# Patient Record
Sex: Female | Born: 1955 | Hispanic: No | Marital: Single | Smoking: Never smoker
Health system: Southern US, Community
[De-identification: ages and names within clinical notes are randomized; demographics above are authoritative.]

## PROBLEM LIST (undated history)

## (undated) DIAGNOSIS — E7800 Pure hypercholesterolemia, unspecified: Secondary | ICD-10-CM

## (undated) DIAGNOSIS — E119 Type 2 diabetes mellitus without complications: Secondary | ICD-10-CM

## (undated) DIAGNOSIS — I10 Essential (primary) hypertension: Secondary | ICD-10-CM

## (undated) DIAGNOSIS — F329 Major depressive disorder, single episode, unspecified: Secondary | ICD-10-CM

## (undated) DIAGNOSIS — G43909 Migraine, unspecified, not intractable, without status migrainosus: Secondary | ICD-10-CM

## (undated) DIAGNOSIS — J45909 Unspecified asthma, uncomplicated: Secondary | ICD-10-CM

## (undated) DIAGNOSIS — F32A Depression, unspecified: Secondary | ICD-10-CM

## (undated) HISTORY — DX: Depression, unspecified: F32.A

## (undated) HISTORY — DX: Pure hypercholesterolemia, unspecified: E78.00

## (undated) HISTORY — DX: Major depressive disorder, single episode, unspecified: F32.9

## (undated) HISTORY — DX: Migraine, unspecified, not intractable, without status migrainosus: G43.909

## (undated) HISTORY — PX: NO PAST SURGERIES: SHX2092

---

## 2000-10-04 ENCOUNTER — Emergency Department (HOSPITAL_COMMUNITY): Admission: EM | Admit: 2000-10-04 | Discharge: 2000-10-05 | Payer: Self-pay | Admitting: *Deleted

## 2001-08-31 ENCOUNTER — Inpatient Hospital Stay (HOSPITAL_COMMUNITY): Admission: EM | Admit: 2001-08-31 | Discharge: 2001-09-04 | Payer: Self-pay | Admitting: *Deleted

## 2001-09-03 ENCOUNTER — Encounter: Payer: Self-pay | Admitting: Internal Medicine

## 2001-10-21 ENCOUNTER — Emergency Department (HOSPITAL_COMMUNITY): Admission: EM | Admit: 2001-10-21 | Discharge: 2001-10-21 | Payer: Self-pay | Admitting: *Deleted

## 2001-10-21 ENCOUNTER — Encounter: Payer: Self-pay | Admitting: *Deleted

## 2002-09-03 ENCOUNTER — Ambulatory Visit (HOSPITAL_COMMUNITY): Admission: RE | Admit: 2002-09-03 | Discharge: 2002-09-03 | Payer: Self-pay | Admitting: Family Medicine

## 2004-01-21 ENCOUNTER — Ambulatory Visit (HOSPITAL_COMMUNITY): Admission: RE | Admit: 2004-01-21 | Discharge: 2004-01-21 | Payer: Self-pay | Admitting: Urology

## 2005-07-10 ENCOUNTER — Emergency Department (HOSPITAL_COMMUNITY): Admission: EM | Admit: 2005-07-10 | Discharge: 2005-07-10 | Payer: Self-pay | Admitting: Emergency Medicine

## 2007-03-01 ENCOUNTER — Ambulatory Visit (HOSPITAL_COMMUNITY): Admission: RE | Admit: 2007-03-01 | Discharge: 2007-03-01 | Payer: Self-pay | Admitting: Family Medicine

## 2007-07-28 ENCOUNTER — Emergency Department (HOSPITAL_COMMUNITY): Admission: EM | Admit: 2007-07-28 | Discharge: 2007-07-28 | Payer: Self-pay | Admitting: Emergency Medicine

## 2008-02-23 ENCOUNTER — Emergency Department (HOSPITAL_COMMUNITY): Admission: EM | Admit: 2008-02-23 | Discharge: 2008-02-23 | Payer: Self-pay | Admitting: Emergency Medicine

## 2008-12-03 ENCOUNTER — Ambulatory Visit (HOSPITAL_COMMUNITY): Admission: RE | Admit: 2008-12-03 | Discharge: 2008-12-03 | Payer: Self-pay | Admitting: Family Medicine

## 2009-02-11 ENCOUNTER — Encounter (INDEPENDENT_AMBULATORY_CARE_PROVIDER_SITE_OTHER): Payer: Self-pay | Admitting: *Deleted

## 2009-03-05 DIAGNOSIS — K644 Residual hemorrhoidal skin tags: Secondary | ICD-10-CM | POA: Insufficient documentation

## 2009-03-05 DIAGNOSIS — R51 Headache: Secondary | ICD-10-CM

## 2009-03-06 ENCOUNTER — Ambulatory Visit: Payer: Self-pay | Admitting: Urgent Care

## 2009-03-06 ENCOUNTER — Encounter (INDEPENDENT_AMBULATORY_CARE_PROVIDER_SITE_OTHER): Payer: Self-pay | Admitting: *Deleted

## 2009-03-06 DIAGNOSIS — IMO0002 Reserved for concepts with insufficient information to code with codable children: Secondary | ICD-10-CM | POA: Insufficient documentation

## 2009-03-06 DIAGNOSIS — J45909 Unspecified asthma, uncomplicated: Secondary | ICD-10-CM | POA: Insufficient documentation

## 2009-03-06 DIAGNOSIS — K219 Gastro-esophageal reflux disease without esophagitis: Secondary | ICD-10-CM | POA: Insufficient documentation

## 2009-03-06 DIAGNOSIS — E785 Hyperlipidemia, unspecified: Secondary | ICD-10-CM

## 2009-03-06 DIAGNOSIS — F1090 Alcohol use, unspecified, uncomplicated: Secondary | ICD-10-CM | POA: Insufficient documentation

## 2009-03-06 DIAGNOSIS — K921 Melena: Secondary | ICD-10-CM

## 2009-03-06 DIAGNOSIS — K5900 Constipation, unspecified: Secondary | ICD-10-CM | POA: Insufficient documentation

## 2009-03-09 ENCOUNTER — Encounter: Payer: Self-pay | Admitting: Urgent Care

## 2009-03-12 ENCOUNTER — Encounter: Payer: Self-pay | Admitting: Internal Medicine

## 2009-03-13 ENCOUNTER — Encounter: Payer: Self-pay | Admitting: Internal Medicine

## 2009-03-17 ENCOUNTER — Encounter: Payer: Self-pay | Admitting: Internal Medicine

## 2009-03-17 ENCOUNTER — Ambulatory Visit: Payer: Self-pay | Admitting: Internal Medicine

## 2009-03-17 ENCOUNTER — Ambulatory Visit (HOSPITAL_COMMUNITY): Admission: RE | Admit: 2009-03-17 | Discharge: 2009-03-17 | Payer: Self-pay | Admitting: Internal Medicine

## 2009-03-19 ENCOUNTER — Telehealth (INDEPENDENT_AMBULATORY_CARE_PROVIDER_SITE_OTHER): Payer: Self-pay

## 2009-03-20 ENCOUNTER — Telehealth (INDEPENDENT_AMBULATORY_CARE_PROVIDER_SITE_OTHER): Payer: Self-pay

## 2010-03-20 ENCOUNTER — Emergency Department (HOSPITAL_COMMUNITY): Admission: EM | Admit: 2010-03-20 | Discharge: 2010-03-20 | Payer: Self-pay | Admitting: Emergency Medicine

## 2010-06-02 ENCOUNTER — Other Ambulatory Visit: Payer: Self-pay

## 2010-06-02 DIAGNOSIS — Z139 Encounter for screening, unspecified: Secondary | ICD-10-CM

## 2010-06-04 ENCOUNTER — Ambulatory Visit (HOSPITAL_COMMUNITY): Payer: Self-pay

## 2010-06-14 ENCOUNTER — Ambulatory Visit (HOSPITAL_COMMUNITY)
Admission: RE | Admit: 2010-06-14 | Discharge: 2010-06-14 | Disposition: A | Discharge status: Home / Self Care | Payer: Self-pay | Source: Ambulatory Visit

## 2010-06-14 DIAGNOSIS — Z139 Encounter for screening, unspecified: Secondary | ICD-10-CM

## 2010-06-18 ENCOUNTER — Other Ambulatory Visit: Payer: Self-pay

## 2010-06-18 DIAGNOSIS — R928 Other abnormal and inconclusive findings on diagnostic imaging of breast: Secondary | ICD-10-CM

## 2010-06-23 ENCOUNTER — Other Ambulatory Visit: Payer: Self-pay | Admitting: Family Medicine

## 2010-06-23 ENCOUNTER — Encounter (HOSPITAL_COMMUNITY): Payer: Self-pay

## 2010-06-23 ENCOUNTER — Other Ambulatory Visit (HOSPITAL_COMMUNITY): Payer: Self-pay | Admitting: Family Medicine

## 2010-06-23 ENCOUNTER — Other Ambulatory Visit (HOSPITAL_COMMUNITY): Payer: Self-pay

## 2010-06-23 DIAGNOSIS — R928 Other abnormal and inconclusive findings on diagnostic imaging of breast: Secondary | ICD-10-CM

## 2010-06-30 ENCOUNTER — Ambulatory Visit (HOSPITAL_COMMUNITY)
Admission: RE | Admit: 2010-06-30 | Discharge: 2010-06-30 | Disposition: A | Discharge status: Home / Self Care | Payer: PRIVATE HEALTH INSURANCE | Source: Ambulatory Visit

## 2010-06-30 ENCOUNTER — Other Ambulatory Visit (HOSPITAL_COMMUNITY): Payer: PRIVATE HEALTH INSURANCE

## 2010-06-30 ENCOUNTER — Ambulatory Visit (HOSPITAL_COMMUNITY)
Admission: RE | Admit: 2010-06-30 | Discharge: 2010-06-30 | Disposition: A | Discharge status: Home / Self Care | Payer: PRIVATE HEALTH INSURANCE | Source: Ambulatory Visit | Attending: Family Medicine | Admitting: Family Medicine

## 2010-06-30 DIAGNOSIS — R928 Other abnormal and inconclusive findings on diagnostic imaging of breast: Secondary | ICD-10-CM

## 2010-06-30 DIAGNOSIS — N6009 Solitary cyst of unspecified breast: Secondary | ICD-10-CM | POA: Insufficient documentation

## 2010-09-17 NOTE — Consult Note (Signed)
[facility]  Patient:    Tiffany Boone Visit Number: [id number] MRN: [id number]          Service Type: MED Location: ICCU [id number] 01 Attending Physician:  Corlis Leak. Dictated by:   Tana Coast, P.A.C. Proc. Date: 09/01/01 Admit Date:  08/31/2001                            Consultation Report  DATE OF BIRTH:  11/19/1955  REASON FOR CONSULTATION:  Chest pain, nausea and vomiting.  HISTORY OF PRESENT ILLNESS:  The patient is a 55 year old Hispanic female who presented to the emergency department yesterday with a three-day history of chest pain and pressure, shortness of breath, and nausea and vomiting. Reportedly, the chest pain improved with sublingual nitroglycerin in the emergency department. On admission, she was found to have a normal CBC and MET-7 except for a potassium of 3.4.  LFTs, amylase, and lipase were also normal.  Chest x-ray revealed no acute disease as well.  Since admission, she had been placed on  a nitroglycerin drip (now being converted to a paste) and Protonix.  The patient continues to have symptoms with very little improvement.  During the night, she had multiple episodes of vomiting.  She describes the pain as a pressure or tightness located horizontally along the lower chest area underneath both breasts and in the lower sternal region.  This has been associated with eating as well as exertion.  She has associated shortness of breath, nausea, and sometimes vomiting.  She has had no diaphoresis related to this, denies any previous fever or chills.  She does have a history of intermittent heartburn but usually not daily.  She complains of heartburn today.  She denies any dysphagia or odynophagia, constipation, diarrhea, melena, or rectal bleeding.  She takes Goodys BC powders at least three times a day for headache.  She complains of occasional extremity weakness.  She complains of waking up in the morning with a  bitter acid taste in her mouth. She has had episodes of mild, small-volume hematemesis in the past.  EKG reveals normal sinus rhythm, rate 85 with early transition.  Hemoglobin has dropped to 11.9, hematocrit 35.3 since admission.  This may be partially due to rehdyration.  Her white count is up to 12.4 from 9.4 today.  CURRENT MEDICATIONS: 1. Tylenol p.r.n. 2. BC Goodys powders t.i.d. for headaches.  ALLERGIES:  No known drug allergies.  PAST MEDICAL HISTORY:  Chronic headaches.  PAST SURGICAL HISTORY:  None.  FAMILY HISTORY:  She has aunt with an unknown type cancer.  She is not sure of any chronic GI illnesses, liver disease, or peptic ulcer disease in the family, or gallbladder disease.  SOCIAL HISTORY:  She is single and has six children.  She has been living in [place] for the past five years.  She denies any tobacco or alcohol use.  REVIEW OF SYSTEMS:  Last menstrual period April 9.  She denies any genitourinary symptoms.  Please see HPI for GI and cardiovascular.  PHYSICAL EXAMINATION:  VITAL SIGNS:  Height 5 feet 6 inches, weight 206.9 pounds.  T-max 99.5, currently 99.4, pulse 72, respirations 20, blood pressure 123/80.  GENERAL:  Very  pleasant, ill-appearing, Hispanic female in no acute distress.  Interpretation is provided by her nephew and her 42 year old son.  Nephew speaks very good English, and I feel like there is no barrier with communication.  SKIN:  Warm and dry, no jaundice.  HEENT:  Conjunctivae pink.  Sclerae nonicteric.  Oropharyngeal mucosa moist and pink.  No lesions, erythema, or exudate.  NECK:  No lymphadenopathy, thyromegaly, or carotid bruits.  CHEST:  Lungs clear to auscultation.  CARDIAC:  Regular rate and rhythm.  Normal S1 and S2 with 2/6 systolic ejection murmur heard best in the upper precordium.  ABDOMEN:  Obese but symmetrical.  Positive bowel sounds, although hypoactive. Soft abdomen.  No rebound tenderness or  guarding.  She has mild discomfort with deep palpation of lower mid abdomen.  No organomegaly or masses.  RECTAL:  The patient and son says this was done in the emergency department. There is no record. They refuse repeat examination.  EXTREMITIES:  No cyanosis or edema.  DIAGNOSTIC DATA:  On Aug 31, 2001, hemoglobin 12.8, hematocrit 37.9, WBC 9.4, platelets 327,000.  Sodium 135, potassium 3.4, BUN 9, creatinine 0.5, glucose 96.  Total bilirubin 0.4, alkaline phosphatase 102, SGOT 32, SGPT 33, albumin 3.4, calcium 9.  Magnesium 1.9, amylase 61, lipase 26.  On Sep 01, 2001, white count 12.4, hemoglobin 11.9, hematocrit 35.3.  Cardiac enzymes negative x 3.  IMPRESSION:  The patient is a pleasant 55 year old Hispanic female with recurrent episodes of chest tightness and pressure associated with shortness of breath, nausea and vomiting.  She states these symptoms are made worse with eating as well as with exertion.  Notably, interpretation was provided by her nephew who speaks very good [place].  This current episode has now persisted for three days.  She continues to have symptoms despite IV Protonix and nitroglycerin drip.  She has had a low-grade fever, currently 99.4.  She is noted to have an elevation in her white count since admission.  She provides a history of typical reflux symptoms as well as small-volume hematemesis and significant nonsteroidal anti-inflammatory drug use which gives me concern for peptic ulcer disease.  However, would consider acute gallbladder disease given her climb in white count and low-grade fever.  Liver functions tests were normal yesterday.   Hemoccult status is unknown given that the patient refused repeat exam.  Apparently examination in the emergency department was not recorded.  SUGGESTIONS: 1. Agree with IV Protonix. 2. Agree with hemoccult test of next stools. 3. Will add LFTs to todays labs. 4. Repeat CBC and LFTs in the morning. 5. Would  consider abdominal ultrasound versus EGD.  Given the fact that     abdominal ultrasound may not be available today, could consider EGD    prior to this.  I will discuss this with Dr. Jena Gauss.  At any rate, would    recommend EGD regardless of ultrasound findings given that she complains    of typical reflux symptoms and has had hematemesis and uses significant    Goodys BC powders.  Notably, the patient is on Lovenox; therefore, would    have to hold this for procedure.  Would also monitor closely for overt    GI bleeding.  Would consider SBE prophylaxis given heart murmur.  I would like to thank Dr. ______ for allowing [place] to participate in the care of this patient. Dictated by:   Tana Coast, P.A.C. Attending Physician:  Corlis Leak DD:  09/01/01 TD:  09/01/01 Job: [id number]

## 2010-09-17 NOTE — Discharge Summary (Signed)
[facility]  Patient:    Tiffany Boone Visit Number: [id number] MRN: [id number]          Service Type: MED Location: 3A [id number] Attending Physician:  Teena Dunk Hor Dictated by:   Renne Musca, M.D. Admit Date:  08/31/2001 Discharge Date: 09/04/2001   CC:         Elliot Cousin, M.D.   Discharge Summary  DISCHARGE DIAGNOSES: 1. Erosive esophagitis. 2. Atypical chest pain. 3. Anxiety. 4. Hyperkalemia resolved. 5. Fatty liver.  DISPOSITION: The patient was discharged to home.  MEDICATIONS: 1. Nexium 40 mg daily. 2. Lortab 5/500 p.r.n. pain not relieved by Tylenol. 3. No aspirin or other over-the-counter pain relievers.  DIET:  Patient advised on low fat diet.  FOLLOWUP:  Follow up with Elliot Cousin, M.D. who was the physician on call for unassigned in 2-4 weeks as a new patient.  ALLERGIES:  No known drug allergies.  HOSPITAL COURSE:  The patient is a 55 year old Hispanic female who speaks no English and history and physical and all communication during the hospital stay was via her 53 year old son.  She presented to the emergency room with chest pain which had been progressive over the proceeding several days.  There was some associated left arm numbness.  There was the possibility of an exertional component.  She had occasional shortness of breath but no diaphoresis or dizziness.  Her EKG showed questionable Q wave in 3 and some T wave inversion and flattening in aVF but no specific ischemic changes.  No tracings were available for comparison.  Because of the presentation and unknown risk factors, i.e., cholesterol, family history, the patient was admitted with possible unstable angina.  It should be noted that she did receive IV nitroglycerin upon admission as well as aspirin.  When nitroglycerin paste was applied she apparently had a quite a headache and vomited.  This was discontinued.  She also received a GI cocktail  which apparently also had an improvement in her symptoms.  Her cardiac work-up was essentially unremarkable.  Cardiology consultation was obtained who felt that no further work-up would be indicated unless the patient had recurrent symptoms with her GI problems being treated.  She had an endoscopy which revealed antral erosions which were described as multiple and deep.  The remaining study was unremarkable.  An ultrasound was performed to rule out occult gallbladder disease but did not show any worrisome findings with the exception of fatty infiltration of the liver.  The patient had been treated with IV protonix and changed to p.o. which she tolerated well.  Her pain improved.  She did note some pain with palpation to the left of her substernal area which musculoskeletal in nature and sometimes she noted some discomfort with deep breathing or coughing but anything consistent.  She was in no respiratory distress.  The patient was hypokalemic upon presentation.  She received some p.o. supplementation as well as IV supplementation and tolerated the regimen well.  Her diet was advanced and she was felt to be discharged.  The patient was educated through her son regarding therapeutic plan, dietary recommendations, and followup.  She will be followed with Dr. Elliot Cousin, again, who is the physician on call for unassigned on the day of admission.  PHYSICAL EXAMINATION:  GENERAL:  At the time of discharge the patient is awake, alert, appropriate, lying supine, able to move about in the bed without difficulty, no respiratory distress.  VITAL SIGNS:  Blood pressure 122/75.  She is  afebrile.  Her pulse is 80 and regular.  Respirations are unlabored.  She has an occasional dry cough.  LUNGS:  Clear to auscultation anteriorly and posteriorly.  HEART:  Regular rate and rhythm.  There is a suggestion of a soft systolic murmur at the left sternal border but no gallop is present.  ABDOMEN:   Obese, soft, nontender.  EXTREMITIES:  Without clubbing, cyanosis, or edema.  The patient has some chest wall tenderness just to the left of the sternum which is reproducible. No skin rash, lesions or breakdown is noted.  NEUROLOGIC:  Nonfocal.  She seems alert and able to answer questions, again, through her interpretor.  ASSESSMENT AND PLAN: 1. Erosive gastritis.  Probably NSAID induced as the patient had been taking    goody powders for headaches prior to her presentation for the last several    weeks.  Continued nexium samples were supplied by my office, as the patient    does not have medicaid or any reliable assistance at this time. 2. Atypical chest pain, probably GI in nature.  No evidence to support a    cardiac etiology.  Her cholesterol on admission was 176 with triglycerides    147, HDL 48, VLDL 29, LDL 99.  She does not smoke and does not appear to    have a significant family history.  She is quite stressed at home caring    for 6 children.  Apparently she has been unemployed.  Social Service Consultation has been obtained prior to discharge to assist her with follow up medical care and medicaid if she is indeed eligible.  During the hospital stay a TSH was 0.79 which was within the normal range.  Remaining laboratory data was unremarkable.  Hemoglobin 12.3, hematocrit 36.  White count 9.5.  Normal platelets.  Normal liver function tests with an albumin of 3.3.  Potassium 3.9, BUN 8, creatinine 0.5. Dictated by:   Renne Musca, M.D. Attending Physician:  Teena Dunk [facility] DD:  09/04/01 TD:  09/05/01 Job: [id number]

## 2010-09-17 NOTE — Op Note (Signed)
[facility]  Patient:    Tiffany Boone Visit Number: [id number] MRN: [id number]          Service Type: MED Location: 3A H086 01 Attending Physician:  Teena Dunk Hor Dictated by:   Roetta Sessions, M.D. Proc. Date: 09/03/01 Admit Date:  08/31/2001 Discharge Date: 09/04/2001   CC:         Shellia Carwin, M.D.   Operative Report  PROCEDURE:  Diagnostic esophagogastroduodenoscopy.  INDICATIONS FOR PROCEDURE:  The patient is a 56 year old lady admitted to the hospital with epigastric and chest pain. MI has been ruled out. She does take aspirin preparations frequently. EGD is now being done to further evaluate her symptoms. This approach has been discussed with [name] via interpreter at length over the weekend. The potential risks, benefits, and alternatives have been reviewed, questions answered. She is agreeable. Please see the documentation in the medical record for more information.  MONITORING:  O2 saturations, blood pressure, pulse and respirations were monitored throughout the entire procedure.  CONSCIOUS SEDATION:  Versed 2 mg IV, Demerol 50 mg IV. Cetacaine spray for topical oropharyngeal anesthesia.  INSTRUMENT:  Olympus video chip gastroscope.  FINDINGS:  Examination of the tubular esophagus revealed no mucosal abnormalities. The EG junction was easily traversed.  STOMACH:  The gastric cavity was empty and insufflated well with air. A thorough examination of the gastric mucosa including a retroflexed view of the proximal stomach and esophagogastric junction demonstrated 4-5 deep linear antral erosions measuring a good 4-5 cm, please see photos. There was no frank ulcer crater and no infiltrating process. The pylorus was patent and easily traversed.  DUODENUM:  The bulb and second portion appeared normal.  THERAPEUTIC/DIAGNOSTIC MANEUVERS PERFORMED:  None.  The patient tolerated the procedure very well and was reactive in  endoscopy.  IMPRESSION:  1. Normal esophagus.  2. Deep linear antral erosions. The remainder of gastric mucosa and duodenum     through the second portion appeared normal.  Todays findings may be secondary to NSAID (aspirin) effect.  They may be contributing to her symptoms.  I do feel we need to rule out occult gallbladder disease.  RECOMMENDATIONS:  1. Continue IV PPI therapy, keep n.p.o., proceed with upper abdominal     ultrasound.  2. Draw H. pylori serologies.  3. Avoid aspirin.  4. Further recommendations to follow. Dictated by:   Roetta Sessions, M.D. Attending Physician:  Teena Dunk [place] DD:  09/03/01 TD:  09/04/01 Job: [id number]

## 2010-09-17 NOTE — H&P (Signed)
[facility]  Patient:    Tiffany Boone Visit Number: [id number] MRN: [id number]          Service Type: MED Location: 2A A219 01 Attending Physician:  Teena Dunk Hor Dictated by:   Rulon Abide, M.D. Admit Date:  08/31/2001   CC:         Elliot Cousin, M.D.   History and Physical  CHIEF COMPLAINT:  "Ive been having pain in my chest for the last three days, and it is getting worse."  HISTORY OF PRESENT ILLNESS:  The patient is a 55 year old Hispanic female with no significant past medical history.  She does not have any local M.D.  The patient states she had an episode of substernal chest pain approximately one year ago which lasted approximately one to two days and resolved.  She did not see any physician for that episode.  The patient states that she has been having substernal chest pain over the last three days.  She states she has pain in her chest "all the time," but it seems to be worse at times with exertion.  This has been associated with some nausea and vomiting x 3 today.  She also states she has occasional shortness of breath with this episode but no diaphoresis, no dizziness.  She denies any radiation to the neck or to the arms but has "occasional numbness" in her hands.  Because of her worsening symptoms she was brought to the emergency room for evaluation.  In the ER she was treated with nitroglycerin sublingual and then nitroglycerin drip.  She states her pain is much better and now is down to 1-2/10.  Her nausea was treated with Phenergan, and she is improved.  Her EKG showed a sinus rhythm.  She does have a T-wave inversion in III and flattening in aVF but no other ischemic changes.  There is a questionable Q-wave in III.  Her initial cardiac enzymes were normal with a CPK of 56 and troponin of 0.01. Because of her symptoms, possible cardiac etiology, and possible unstable angina, it was felt that admission was  indicated.  REVIEW OF SYSTEMS:  She has headache now from the nitroglycerin which has improved with treatment.  No recent change in her vision or hearing.  No sore mouth, no sore throat.  She has had chest pain as described above with associated shortness of breath at times.  No abdominal pain.  She has had nausea and vomiting x 3 today.  She denies any significant constipation or diarrhea.  She denies any bleeding from the mouth, hematuria, or bright red blood per rectum.  She has pain at times in her hands and has pain in her ankles and knees when she walks.  There has been no lower extremity edema.  No fever or chills.  PAST MEDICAL HISTORY:  As dictated below.  PAST SURGICAL HISTORY:  None.  ALLERGIES:  None.  MEDICATIONS:  None.  FAMILY HISTORY:  Unobtainable at this time as she cannot give me a good family history.  SOCIAL HISTORY:  The patient is apparently separated.  She has six children and does not work.  She simply takes care of her children at home.  She has no history of cigarette use or abuse or alcohol use or abuse.  PHYSICAL EXAMINATION:  VITAL SIGNS:  In the emergency room she had vital signs which showed a temperature of 98.4, blood pressure 143/76, respirations 16.  GENERAL:  Obese Hispanic female sitting at approximately 45  degrees, stating her chest pain is better.  HEENT:  Atraumatic, normocephalic.  Pupils are equal, round, and reactive to light.  Conjunctivae clear.  Sclerae nonicteric.  Oropharynx is clear.  Mucous membranes are moist.  NECK:  Supple.  No adenopathy.  No thyromegaly.  No JVD.  No bruits.  LUNGS:  Clear to auscultation bilaterally.  HEART:  Regular rate and rhythm.  S1 and S2 were noted.  Questionable 1/6 systolic murmur.  ABDOMEN:  Obese.  Soft, nontender, nondistended.  Positive bowel sounds.  No masses.  GENITOURINARY:  Not done.  RECTAL:  Guaiac-negative stool.  EXTREMITIES:  No significant edema.  NEUROLOGIC:   Basically nonfocal grossly.  LABORATORY DATA:  EKG showed sinus rhythm, T-wave inversion in III, questionable Q-wave in III.  Some T-wave flattening in aVF but no other ischemic changes.  CPK 54, troponin 0.01.  Chemistry panel showed potassium low at 3.4, BUN and creatinine normal at 9 and 0.5 respectively.  Liver function tests normal. CBC normal with white cell count of 9.4, hemoglobin 12.8, platelets 327,000.  Chest x-ray is not available for me to review at this time.  Apparently it was negative for any active disease per ER M.D.  PROBLEM LIST: 1. Chest pain, rule out unstable angina with electrocardiogram now showing    what looks like a T-wave inversion in lead III and flattening in aVF but    no other ischemic changes.  Again, there is a questionable Q-wave in    lead III but no other Q-waves noted.  Initial cardiac enzymes are normal. 2. Nausea and vomiting, questionably secondary to gastroenteritis but need to    rule out a cardiac etiology.  Please note, her liver function tests are    normal. 3. Obesity. 4. Occasional pain in her ankles and knees with ambulation, probably    secondary to arthritis. 5. Hypokalemia with potassium of 3.4, probably secondary to nausea and    vomiting.  PLAN:  I will admit her to the ICU and try to wean her off this nitroglycerin drip over night.  Will rule out MI by enzymes.  I will begin Lovenox, beta blocker, and would consider ACE inhibitor if she rules in.  She may be a candidate for a 2-D echocardiogram, but I will get a cardiology consultation on Monday and see if they feel that she would be a candidate for 2-D echocardiogram versus simple stress testing versus cardiac catheterization based on her symptoms and laboratories over the weekend.  If she rules in or worsens, I will consider transferring her to [facility].  I will try GI cocktail now.  Begin a proton pump inhibitor with IV Protonix.  I will go  ahead and check a liver  panel to further risk stratify her and check a magnesium level secondary to her low potassium and replace that if needed.  I will replace her potassium IV for now and use p.r.n. Phenergan.Dictated by: Rulon Abide, M.D. Attending Physician:  Teena Dunk [facility] DD:  08/31/01 TD:  09/02/01 Job: [id number]

## 2012-01-08 DIAGNOSIS — R042 Hemoptysis: Secondary | ICD-10-CM

## 2012-01-24 ENCOUNTER — Other Ambulatory Visit (HOSPITAL_COMMUNITY): Payer: Self-pay | Admitting: *Deleted

## 2012-02-01 ENCOUNTER — Ambulatory Visit (HOSPITAL_COMMUNITY)
Admission: RE | Admit: 2012-02-01 | Discharge: 2012-02-01 | Disposition: A | Discharge status: Home / Self Care | Payer: PRIVATE HEALTH INSURANCE | Source: Ambulatory Visit | Attending: *Deleted | Admitting: *Deleted

## 2012-02-01 DIAGNOSIS — N63 Unspecified lump in unspecified breast: Secondary | ICD-10-CM | POA: Insufficient documentation

## 2012-07-27 ENCOUNTER — Ambulatory Visit (HOSPITAL_COMMUNITY)
Admission: RE | Admit: 2012-07-27 | Discharge: 2012-07-27 | Disposition: A | Discharge status: Home / Self Care | Payer: Self-pay | Source: Ambulatory Visit | Attending: Pulmonary Disease | Admitting: Pulmonary Disease

## 2012-07-27 ENCOUNTER — Other Ambulatory Visit (HOSPITAL_COMMUNITY): Payer: Self-pay | Admitting: Pulmonary Disease

## 2012-07-27 DIAGNOSIS — Z201 Contact with and (suspected) exposure to tuberculosis: Secondary | ICD-10-CM

## 2012-07-27 DIAGNOSIS — Z111 Encounter for screening for respiratory tuberculosis: Secondary | ICD-10-CM | POA: Insufficient documentation

## 2013-01-02 ENCOUNTER — Other Ambulatory Visit (HOSPITAL_COMMUNITY): Payer: Self-pay | Admitting: Physician Assistant

## 2013-01-02 DIAGNOSIS — Z139 Encounter for screening, unspecified: Secondary | ICD-10-CM

## 2013-02-04 ENCOUNTER — Ambulatory Visit (HOSPITAL_COMMUNITY)
Admission: RE | Admit: 2013-02-04 | Discharge: 2013-02-04 | Disposition: A | Discharge status: Home / Self Care | Payer: Self-pay | Source: Ambulatory Visit | Attending: Physician Assistant | Admitting: Physician Assistant

## 2013-02-04 DIAGNOSIS — Z139 Encounter for screening, unspecified: Secondary | ICD-10-CM

## 2013-02-06 ENCOUNTER — Other Ambulatory Visit: Payer: Self-pay | Admitting: Physician Assistant

## 2013-02-06 DIAGNOSIS — R928 Other abnormal and inconclusive findings on diagnostic imaging of breast: Secondary | ICD-10-CM

## 2013-02-27 ENCOUNTER — Other Ambulatory Visit: Payer: Self-pay | Admitting: Physician Assistant

## 2013-02-27 ENCOUNTER — Ambulatory Visit (HOSPITAL_COMMUNITY)
Admission: RE | Admit: 2013-02-27 | Discharge: 2013-02-27 | Disposition: A | Discharge status: Home / Self Care | Payer: PRIVATE HEALTH INSURANCE | Source: Ambulatory Visit | Attending: Physician Assistant | Admitting: Physician Assistant

## 2013-02-27 DIAGNOSIS — N6009 Solitary cyst of unspecified breast: Secondary | ICD-10-CM | POA: Insufficient documentation

## 2013-02-27 DIAGNOSIS — R928 Other abnormal and inconclusive findings on diagnostic imaging of breast: Secondary | ICD-10-CM

## 2013-06-19 ENCOUNTER — Encounter (HOSPITAL_COMMUNITY): Payer: Self-pay | Admitting: Emergency Medicine

## 2013-06-19 ENCOUNTER — Emergency Department (HOSPITAL_COMMUNITY): Payer: PRIVATE HEALTH INSURANCE

## 2013-06-19 ENCOUNTER — Emergency Department (HOSPITAL_COMMUNITY)
Admission: EM | Admit: 2013-06-19 | Discharge: 2013-06-19 | Disposition: A | Discharge status: Home / Self Care | Payer: PRIVATE HEALTH INSURANCE | Attending: Emergency Medicine | Admitting: Emergency Medicine

## 2013-06-19 DIAGNOSIS — J45909 Unspecified asthma, uncomplicated: Secondary | ICD-10-CM

## 2013-06-19 DIAGNOSIS — J45901 Unspecified asthma with (acute) exacerbation: Secondary | ICD-10-CM | POA: Insufficient documentation

## 2013-06-19 DIAGNOSIS — I10 Essential (primary) hypertension: Secondary | ICD-10-CM | POA: Insufficient documentation

## 2013-06-19 DIAGNOSIS — E119 Type 2 diabetes mellitus without complications: Secondary | ICD-10-CM | POA: Insufficient documentation

## 2013-06-19 HISTORY — DX: Unspecified asthma, uncomplicated: J45.909

## 2013-06-19 HISTORY — DX: Type 2 diabetes mellitus without complications: E11.9

## 2013-06-19 HISTORY — DX: Essential (primary) hypertension: I10

## 2013-06-19 LAB — GLUCOSE, CAPILLARY: Glucose-Capillary: 120 mg/dL — ABNORMAL HIGH (ref 70–99)

## 2013-06-19 MED ORDER — IPRATROPIUM BROMIDE 0.02 % IN SOLN: 0.5000 mg | Freq: Once | RESPIRATORY_TRACT | Status: DC

## 2013-06-19 MED ORDER — IPRATROPIUM-ALBUTEROL 0.5-2.5 (3) MG/3ML IN SOLN
3.0000 mL | Freq: Once | RESPIRATORY_TRACT | Status: AC
  Administered 2013-06-19: 3 mL via RESPIRATORY_TRACT
  Filled 2013-06-19: qty 3

## 2013-06-19 MED ORDER — ALBUTEROL SULFATE HFA 108 (90 BASE) MCG/ACT IN AERS: 2.0000 | INHALATION_SPRAY | RESPIRATORY_TRACT | Status: DC | PRN

## 2013-06-19 MED ORDER — ALBUTEROL SULFATE (2.5 MG/3ML) 0.083% IN NEBU
2.5000 mg | INHALATION_SOLUTION | Freq: Once | RESPIRATORY_TRACT | Status: AC
  Administered 2013-06-19: 2.5 mg via RESPIRATORY_TRACT
  Filled 2013-06-19: qty 3

## 2013-06-19 MED ORDER — ALBUTEROL SULFATE HFA 108 (90 BASE) MCG/ACT IN AERS
INHALATION_SPRAY | RESPIRATORY_TRACT | Status: AC
  Filled 2013-06-19: qty 6.7

## 2013-06-19 MED ORDER — ALBUTEROL SULFATE (2.5 MG/3ML) 0.083% IN NEBU: 5.0000 mg | INHALATION_SOLUTION | Freq: Once | RESPIRATORY_TRACT | Status: DC

## 2013-06-19 NOTE — ED Notes (Signed)
Sob and chest pain, generalized weakness

## 2013-06-19 NOTE — Discharge Instructions (Signed)
Asma - Adultos  (Asthma, Adult)  El asma es una enfermedad de los pulmones en la que las vías respiratorias se estrechan y obstruyen. El asma puede causar dificultad para respirar. El asma no puede curarse, pero los medicamentos y los cambios en el estilo de vida pueden ayudar a controlarla. Los siguientes factores pueden iniciar (desencadenar) el asma:  · Escamas de la piel de los animales (caspa).  · Polvo.  · Cucarachas.  · Polen.  · Moho.  · Humo.  · Productos de limpieza.  · Aerosoles para el cabello.  · Vapores de pintura u olores fuertes.  · Aire frío, cambios climáticos y vientos.  · Llanto o risa intensos.  · Estrés.  · Ciertos medicamentos o drogas.  · Alimentos, como frutas secas, papas fritas y vinos espumantes.  · Infecciones o afecciones (resfríos, gripe).  · Haga actividad física.  · Ciertas enfermedades o afecciones.  · Ejercicio o actividades extenuantes.  CUIDADOS EN EL HOGAR   · Tome los medicamentos como le indicó el médico.  · Use un medidor de flujo espiratorio máximo como le indicó su médico. El medidor de flujo espiratorio máximo es una herramienta que mide el funcionamiento de los pulmones.  · Anote y lleve un registro de los valores del medidor de flujo espiratorio máximo.  · Conozca el plan de acción para el asma y úselo. El plan de acción para el asma es una planificación por escrito para el control y tratamiento de sus crisis asmáticas.  · Para prevenir las crisis asmáticas:  · No fume. Aléjese del humo de otros fumadores.  · Cambie el filtro de la calefacción y del aire acondicionado con frecuencia.  · Limite el uso de hogares o estufas a leña.  · Elimine las plagas (como cucarachas, ratones) y sus excrementos.  · Elimine las plantas si observa moho en ellas.  · Limpie los pisos. Elimine el polvo regularmente. Use productos de limpieza que sean inoloros.  · Pídale a alguien que pase la aspiradora cuando usted no se encuentre en casa. Utilice una aspiradora con filtros HEPA, siempre que  le sea posible.  · Reemplace las alfombras por pisos de madera, baldosas o vinilo. Las alfombras pueden retener las escamas de la piel de los animales y el polvo.  · Use almohadas, mantas y cubre colchones antialérgicos.  · Lave las sábanas y las mantas todas las semanas con agua caliente y séquelas con aire caliente.  · Use mantas de poliéster o algodón.  · Limpie baños y cocinas con lavandina. Si fuera posible, pídale a alguien que vuelva a pintar las paredes de estas habitaciones con una pintura resistente a los hongos. Aléjese de las habitaciones que se están limpiando y pintando.  · Lávese las manos con frecuencia.  SOLICITE AYUDA SI:  · Hace un silbido al respirar (sibilancias), tiene falta de aire o tiene tos incluso después de tomar los medicamentos para prevenir crisis.  · El moco coloreado que elimina (esputo) es más denso de lo normal.  · El moco coloreado que elimina cambia de trasparente o blanco a amarillo, verde, gris o sanguinolento.  · Tiene problemas causados por el medicamento que toma, por ejemplo:  · Una erupción.  · Picazón.  · Hinchazón.  · Problemas respiratorios.  · Necesita un medicamento de alivio más de 2 a 3 veces por semana.  · El flujo espiratorio máximo aún está en 50 a 79 % del mejor valor personal después de seguir el plan de acción durante 1 hora.    SOLICITE AYUDA DE INMEDIATO SI:   · Parece empeorar y no responde al medicamento durante una crisis asmática.  · Le falta el aire, incluso en reposo.  · Le falta el aire incluso cuando hace muy poca actividad física.  · Tiene dificultad para comer, beber o hablar.  · Siente dolor en el pecho.  · La frecuencia cardíaca está acelerada.  · Sus labios o uñas comienzan a volverse azules.  · Usted se siente mareado, débil o se desmaya.  · Su flujo máximo es menor del 50 % del mejor valor personal.  · Tiene fiebre o síntomas que persisten durante más de 2 a 3 días.  · Tiene fiebre y los síntomas empeoran repentinamente.  ASEGÚRESE DE QUE:    · Comprende estas instrucciones.  · Controlará su afección.  · Recibirá ayuda de inmediato si no mejora o si empeora.  Document Released: 07/15/2008 Document Revised: 02/06/2013  [employer]® Patient Information ©2014 [employer].

## 2013-06-19 NOTE — ED Provider Notes (Signed)
CSN: [id number]     Arrival date & time 06/19/13  0426 History   First MD Initiated Contact with Patient 06/19/13 0445     Chief Complaint  Patient presents with  . Shortness of Breath     (Consider location/radiation/quality/duration/timing/severity/associated sxs/prior Treatment) HPI This is a 58 year old female with a history of asthma and diabetes. She is here with a one-day history of shortness of breath and chest tightness. She states this is consistent with previous asthma but she has no albuterol or other asthma treatment currently. She is also feeling generally weak. She has been coughing. She has not had a fever. She has not had nausea or vomiting but has had constipation. She has chronic tingling in her hands and feet particularly at night. This has not acutely changed.  Past Medical History  Diagnosis Date  . Asthma   . Hypertension   . Diabetes mellitus without complication    History reviewed. No pertinent past surgical history. No family history on file. History  Substance Use Topics  . Smoking status: Never Smoker   . Smokeless tobacco: Not on file  . Alcohol Use: No   OB History   Grav Para Term Preterm Abortions TAB SAB Ect Mult Living                 Review of Systems  All other systems reviewed and are negative.    Allergies  Review of patient's allergies indicates no known allergies.  Home Medications  Unspecified antihyperglycemics.  BP 142/97  Pulse 82  Temp(Src) 98.4 F (36.9 C) (Oral)  Resp 24  Wt 264 lb (119.75 kg)  SpO2 96%  Physical Exam General: Well-developed, obese female in no acute distress; appearance consistent with age of record HENT: normocephalic; atraumatic Eyes: pupils equal, round and reactive to light; extraocular muscles intact Neck: supple Heart: regular rate and rhythm Lungs: Decreased air movement bilaterally; mildly tach at Abdomen: soft; nondistended; nontender; bowel sounds present Extremities: No deformity;  full range of motion Neurologic: Awake, alert and oriented; motor function intact in all extremities and symmetric; no facial droop Skin: Warm and dry Psychiatric: Flat affect    ED Course  Procedures (including critical care time)   MDM   Nursing notes and vitals signs, including pulse oximetry, reviewed.  Summary of this visit's results, reviewed by myself:  Labs:  Results for orders placed during the hospital encounter of 06/19/13 (from the past 24 hour(s))  GLUCOSE, CAPILLARY     Status: Abnormal   Collection Time    06/19/13  5:11 AM      Result Value Ref Range   Glucose-Capillary 120 (*) 70 - 99 mg/dL    Imaging Studies: Dg Chest 2 View  06/19/2013   CLINICAL DATA:  Chest pain, shortness of breath, asthma.  EXAM: CHEST  2 VIEW  COMPARISON:  Chest radiograph July 27, 2012  FINDINGS: Cardiomediastinal silhouette is unremarkable and unchanged. Similar mild interstitial prominence without pleural effusions or focal consolidations. No pneumothorax.  Multiple EKG lines overlie the patient and may obscure subtle underlying pathology. Mild degenerative change of thoracic spine. Moderate right acromioclavicular osteoarthrosis.  IMPRESSION: Stable chronic interstitial changes without acute cardiopulmonary process.   Electronically Signed   By: Awilda Metroourtnay  Bloomer   On: 06/19/2013 05:42    6:13 AM Air movement improved, patient feeling better after albuterol and Atrovent neb treatment followed by albuterol inhaler with spacer and teaching.      Hanley SeamenJohn L Klaire Court, MD 06/19/13 [id number]

## 2015-04-01 ENCOUNTER — Emergency Department (HOSPITAL_COMMUNITY): Payer: Self-pay

## 2015-04-01 ENCOUNTER — Emergency Department (HOSPITAL_COMMUNITY): Payer: PRIVATE HEALTH INSURANCE

## 2015-04-01 ENCOUNTER — Emergency Department (HOSPITAL_COMMUNITY)
Admission: EM | Admit: 2015-04-01 | Discharge: 2015-04-01 | Disposition: A | Discharge status: Home / Self Care | Payer: Self-pay | Attending: Emergency Medicine | Admitting: Emergency Medicine

## 2015-04-01 ENCOUNTER — Encounter (HOSPITAL_COMMUNITY): Payer: Self-pay | Admitting: Cardiology

## 2015-04-01 DIAGNOSIS — M545 Low back pain: Secondary | ICD-10-CM | POA: Insufficient documentation

## 2015-04-01 DIAGNOSIS — J45901 Unspecified asthma with (acute) exacerbation: Secondary | ICD-10-CM | POA: Insufficient documentation

## 2015-04-01 DIAGNOSIS — M549 Dorsalgia, unspecified: Secondary | ICD-10-CM

## 2015-04-01 DIAGNOSIS — R202 Paresthesia of skin: Secondary | ICD-10-CM | POA: Insufficient documentation

## 2015-04-01 DIAGNOSIS — I10 Essential (primary) hypertension: Secondary | ICD-10-CM | POA: Insufficient documentation

## 2015-04-01 DIAGNOSIS — R300 Dysuria: Secondary | ICD-10-CM | POA: Insufficient documentation

## 2015-04-01 DIAGNOSIS — E119 Type 2 diabetes mellitus without complications: Secondary | ICD-10-CM | POA: Insufficient documentation

## 2015-04-01 LAB — COMPREHENSIVE METABOLIC PANEL
ALT: 23 U/L (ref 14–54)
ANION GAP: 8 (ref 5–15)
AST: 32 U/L (ref 15–41)
Albumin: 3.4 g/dL — ABNORMAL LOW (ref 3.5–5.0)
Alkaline Phosphatase: 95 U/L (ref 38–126)
BILIRUBIN TOTAL: 0.5 mg/dL (ref 0.3–1.2)
BUN: 6 mg/dL (ref 6–20)
CO2: 28 mmol/L (ref 22–32)
Calcium: 9.2 mg/dL (ref 8.9–10.3)
Chloride: 102 mmol/L (ref 101–111)
Creatinine, Ser: 0.46 mg/dL (ref 0.44–1.00)
GFR calc Af Amer: >60 mL/min (ref >60)
GFR calc non Af Amer: >60 mL/min (ref >60)
Glucose, Bld: 148 mg/dL — ABNORMAL HIGH (ref 65–99)
POTASSIUM: 3.8 mmol/L (ref 3.5–5.1)
Sodium: 138 mmol/L (ref 135–145)
TOTAL PROTEIN: 7.3 g/dL (ref 6.5–8.1)

## 2015-04-01 LAB — URINALYSIS, ROUTINE W REFLEX MICROSCOPIC
BILIRUBIN URINE: NEGATIVE
Glucose, UA: NEGATIVE mg/dL
Hgb urine dipstick: NEGATIVE
KETONES UR: NEGATIVE mg/dL
NITRITE: NEGATIVE
Protein, ur: 30 mg/dL — AB
SPECIFIC GRAVITY, URINE: 1.015 (ref 1.005–1.030)
pH: 7.5 (ref 5.0–8.0)

## 2015-04-01 LAB — CBC
HEMATOCRIT: 34.4 % — AB (ref 36.0–46.0)
HEMOGLOBIN: 10.6 g/dL — AB (ref 12.0–15.0)
MCH: 25.2 pg — ABNORMAL LOW (ref 26.0–34.0)
MCHC: 30.8 g/dL (ref 30.0–36.0)
MCV: 81.9 fL (ref 78.0–100.0)
Platelets: 340 10*3/uL (ref 150–400)
RBC: 4.2 MIL/uL (ref 3.87–5.11)
RDW: 15.2 % (ref 11.5–15.5)
WBC: 9.2 10*3/uL (ref 4.0–10.5)

## 2015-04-01 LAB — URINE MICROSCOPIC-ADD ON

## 2015-04-01 MED ORDER — MORPHINE SULFATE (PF) 4 MG/ML IV SOLN
4.0000 mg | Freq: Once | INTRAVENOUS | Status: AC
  Administered 2015-04-01: 4 mg via INTRAVENOUS
  Filled 2015-04-01: qty 1

## 2015-04-01 MED ORDER — PREDNISONE 50 MG PO TABS: 50.0000 mg | ORAL_TABLET | Freq: Every day | ORAL | Status: AC

## 2015-04-01 MED ORDER — FENTANYL CITRATE (PF) 100 MCG/2ML IJ SOLN
50.0000 ug | Freq: Once | INTRAMUSCULAR | Status: AC
  Administered 2015-04-01: 50 ug via INTRAVENOUS
  Filled 2015-04-01: qty 2

## 2015-04-01 MED ORDER — IPRATROPIUM-ALBUTEROL 0.5-2.5 (3) MG/3ML IN SOLN
3.0000 mL | Freq: Once | RESPIRATORY_TRACT | Status: AC
  Administered 2015-04-01: 3 mL via RESPIRATORY_TRACT
  Filled 2015-04-01: qty 3

## 2015-04-01 NOTE — Discharge Instructions (Signed)
Dolor de espalda en adultos  (Back Pain, Adult)  El dolor de espalda es muy frecuente en los adultos. La causa del dolor de espalda es rara vez peligrosa y el dolor a menudo mejora con el tiempo. Es posible que se desconozca la causa de esta afección. Algunas causas comunes son las siguientes:  · Distensión de los músculos o ligamentos que sostienen la columna vertebral.  · Desgaste (degeneración) de los discos vertebrales.  · Artritis.  · Lesiones directas en la espalda.  En muchas personas, el dolor de espalda es recurrente. Como rara vez es peligroso, las personas pueden aprender a manejar esta afección por sí mismas.  INSTRUCCIONES PARA EL CUIDADO EN EL HOGAR  Controle su dolor de espalda a fin de detectar algún cambio. Las siguientes indicaciones ayudarán a aliviar cualquier molestia que pueda sentir:  · Permanezca activo. Si permanece sentado o de pie en un mismo lugar durante mucho tiempo, se tensiona la espalda. No se siente, conduzca o permanezca de pie en un mismo lugar durante más de 30 minutos seguidos. Realice caminatas cortas en superficies planas tan pronto como le sea posible. Trate de caminar un poco más de tiempo cada día.  · Haga ejercicio regularmente como se lo haya indicado el médico. El ejercicio ayuda a que su espalda se cure más rápidamente. También ayuda a prevenir futuras lesiones al mantener los músculos fuertes y flexibles.  · No permanezca en la cama. Si hace reposo más de 1 a 2 días, puede demorar su recuperación.  · Preste atención a su cuerpo al inclinarse y levantarse. Las posiciones más cómodas son las que ejercen menos tensión en la espalda en recuperación. Siempre use técnicas apropiadas para levantar objetos, como por ejemplo:    Flexionar las rodillas.    Mantener la carga cerca del cuerpo.    No torcerse.  · Encuentre una posición cómoda para dormir. Use un colchón firme y recuéstese de costado con las rodillas ligeramente flexionadas. Si se recuesta sobre la espalda, coloque  una almohada debajo de las rodillas.  · Evite sentir ansiedad o estrés. El estrés aumenta la tensión muscular y puede empeorar el dolor de espalda. Es importante reconocer si se siente ansioso o estresado y aprender maneras de controlarlo, por ejemplo haciendo ejercicio.  · Tome los medicamentos solamente como se lo haya indicado el médico. Los medicamentos de venta libre para aliviar el dolor y la inflamación a menudo son los más eficaces. El médico puede recetarle relajantes musculares. Estos medicamentos ayudan a calmar el dolor de modo que pueda reanudar más rápidamente sus actividades normales y el ejercicio saludable.  · Aplique hielo sobre la zona lesionada.    Ponga el hielo en una bolsa plástica.    Coloque una toalla entre la piel y la bolsa de hielo.    Deje el hielo durante 20 minutos, 2 a 3 veces por día, durante los primeros 2 o 3 días. Después de eso, puede alternar el hielo y el calor para reducir el dolor y los espasmos.  · Mantenga un peso saludable. El exceso de peso ejerce presión adicional sobre la espalda y hace que resulte difícil mantener una buena postura.  SOLICITE ATENCIÓN MÉDICA SI:  · Siente un dolor que no se alivia con reposo o medicamentos.  · Siente mucho dolor que se extiende a las piernas o los glúteos.  · El dolor no mejora en una semana.  · Siente dolor por la noche.  · Pierde peso.  · Siente escalofríos o fiebre.  SOLICITE ATENCIÓN MÉDICA DE INMEDIATO SI:   ·   Tiene nuevos problemas para controlar la vejiga o los intestinos.  · Siente debilidad o adormecimiento inusuales en los brazos o en las piernas.  · Siente náuseas o vómitos.  · Siente dolor abdominal.  · Siente que va a desmayarse.     Esta información no tiene como fin reemplazar el consejo del médico. Asegúrese de hacerle al médico cualquier pregunta que tenga.     Document Released: 04/18/2005 Document Revised: 05/09/2014  Elsevier Interactive Patient Education ©2016 [employer]

## 2015-04-01 NOTE — ED Notes (Signed)
Patient left at this time with all belongings. 

## 2015-04-01 NOTE — ED Notes (Addendum)
Pt reports lower back pain and headache for the past week. Denies any injury to the area. Reports a hx of migraines. Reports dysuria.

## 2015-04-01 NOTE — ED Provider Notes (Signed)
CSN: [id number]     Arrival date & time 04/01/15  1354 History   First MD Initiated Contact with Patient 04/01/15 1722     Chief Complaint  Patient presents with  . Back Pain  . Headache     (Consider location/radiation/quality/duration/timing/severity/associated sxs/prior Treatment) HPI  Patient is a 59 year old female with a past medical history significant for asthma, hypertension, diabetes, morbid obesity, hypertension, who presents to the emergency department with lower back pain. Patient complains of a one-week history of lower back pain that is sharp, radiates down bilateral legs (L>R), constant. Pain is worse with walking. Nothing has improved the pain. Has not taken anything for the pain. Has no prior history of back pain. Pain is associated with tingling sensation that goes down bilateral legs. Denies recent falls, injury to the back, will lifting heavy objects, twisting injuries. Denies fevers, chills, bowel or bladder incontinence, headache, perianal numbness, lower extremity weakness, weight loss, night sweats. No history of cancer, no recent surgeries or steroid use. No IVDU. Patient also complaining of a couple day history of dysuria.   Past Medical History  Diagnosis Date  . Asthma   . Hypertension   . Diabetes mellitus without complication (HCC)    History reviewed. No pertinent past surgical history. History reviewed. No pertinent family history. Social History  Substance Use Topics  . Smoking status: Never Smoker   . Smokeless tobacco: None  . Alcohol Use: No   OB History    No data available     Review of Systems  Constitutional: Negative for fever, appetite change, fatigue and unexpected weight change.  HENT: Negative for congestion and trouble swallowing.   Respiratory: Positive for shortness of breath and wheezing. Negative for cough and chest tightness.   Cardiovascular: Negative for chest pain and leg swelling.  Gastrointestinal: Negative for nausea,  vomiting, abdominal pain, diarrhea and blood in stool.  Genitourinary: Positive for dysuria. Negative for urgency, hematuria, flank pain and difficulty urinating.  Musculoskeletal: Positive for back pain. Negative for joint swelling and gait problem.  Skin: Negative for rash and wound.  Neurological: Negative for dizziness, seizures, facial asymmetry, weakness and light-headedness.  Psychiatric/Behavioral: Negative for behavioral problems.  All other systems reviewed and are negative.     Allergies  Review of patient's allergies indicates no known allergies.  Home Medications   Prior to Admission medications   Medication Sig Start Date End Date Taking? Authorizing Provider  predniSONE (DELTASONE) 50 MG tablet Take 1 tablet (50 mg total) by mouth daily with breakfast. 04/01/15 04/08/15  Corena Herter, MD   BP 132/83 mmHg  Pulse 89  Temp(Src) 98.5 F (36.9 C) (Oral)  Resp 18  Ht  (1.676 m)  Wt 119.75 kg  BMI 42.63 kg/m2  SpO2 95% Physical Exam  Constitutional: She is oriented to person, place, and time. She appears well-developed and well-nourished. She appears distressed.  Morbidly obese  HENT:  Head: Normocephalic and atraumatic.  Mouth/Throat: Oropharynx is clear and moist.  Eyes: Conjunctivae and EOM are normal. Pupils are equal, round, and reactive to light.  Neck: Normal range of motion. Neck supple.  Cardiovascular: Normal rate, regular rhythm, normal heart sounds and intact distal pulses.   No murmur heard. Pulmonary/Chest: Effort normal and breath sounds normal. No respiratory distress. She has no wheezes. She has no rales.  Abdominal: Soft. She exhibits no distension. There is no tenderness. There is no rebound and no guarding.  Genitourinary:  Normal rectal tone, no gross blood  Musculoskeletal:  Normal range of motion. She exhibits no edema.  Midline lumbar spine TTP, no bony deformity, no step-offs  Neurological: She is alert and oriented to person, place,  and time. She has normal strength and normal reflexes. She displays no tremor. No cranial nerve deficit or sensory deficit. She exhibits normal muscle tone. She displays a negative Romberg sign. Coordination and gait normal. GCS eye subscore is 4. GCS verbal subscore is 5. GCS motor subscore is 6. She displays no Babinski's sign on the right side. She displays no Babinski's sign on the left side.  Negative straight leg raise. Normal perianal sensation.   Skin: Skin is warm. No pallor.  Psychiatric: She has a normal mood and affect.  Nursing note and vitals reviewed.   ED Course  Procedures (including critical care time) Labs Review Labs Reviewed  COMPREHENSIVE METABOLIC PANEL - Abnormal; Notable for the following:    Glucose, Bld 148 (*)    Albumin 3.4 (*)    All other components within normal limits  CBC - Abnormal; Notable for the following:    Hemoglobin 10.6 (*)    HCT 34.4 (*)    MCH 25.2 (*)    All other components within normal limits  URINALYSIS, ROUTINE W REFLEX MICROSCOPIC (NOT [facility]) - Abnormal; Notable for the following:    APPearance CLOUDY (*)    Protein, ur 30 (*)    Leukocytes, UA TRACE (*)    All other components within normal limits  URINE MICROSCOPIC-ADD ON - Abnormal; Notable for the following:    Squamous Epithelial / LPF 6-30 (*)    Bacteria, UA FEW (*)    All other components within normal limits    Imaging Review Dg Chest 2 View  04/01/2015  CLINICAL DATA:  Shortness of breath today EXAM: CHEST  2 VIEW COMPARISON:  June 19, 2013 FINDINGS: The heart size and mediastinal contours are within normal limits. There is no focal infiltrate or pulmonary edema. There is mild diffuse increased pulmonary interstitium bilaterally. Stable small calcified granuloma is identified in the left upper lobe unchanged. Degenerative joint changes of the spine are noted. IMPRESSION: Mild diffuse increased pulmonary interstitium bilaterally. This is nonspecific, but can be seen  in for viral infections. Electronically Signed   By: Sherian ReinWei-Chen  Lin M.D.   On: 04/01/2015 19:03   Mr Lumbar Spine Wo Contrast  04/01/2015  CLINICAL DATA:  Low back pain and headache over the last week. History migraine headaches. EXAM: MRI LUMBAR SPINE WITHOUT CONTRAST TECHNIQUE: Multiplanar, multisequence MR imaging of the lumbar spine was performed. No intravenous contrast was administered. COMPARISON:  CT of the abdomen and pelvis 01/21/2004 FINDINGS: Normal signal is present in the conus medullaris which terminates at T12-L1, within normal limits. Marrow signal and vertebral body heights are maintained. Grade 1 anterolisthesis at L4-5 measures 7 mm. Alignment is otherwise anatomic. There is mild distention of the urinary bladder. Limited imaging of the abdomen is otherwise within normal limits. The disc levels at L2-3 and above are normal. L3-4: There is desiccation of the lumbar disc. Disc height is maintained. No focal protrusion is evident. Mild facet hypertrophy is present bilaterally. A synovial cyst is present on the right without significant stenosis. L4-5: There is uncovering of a broad-based disc protrusion associated with the anterolisthesis. Moderate to severe facet hypertrophy is present bilaterally appearing spurring contributes to moderate subarticular narrowing. Mild foraminal narrowing is worse on the right. L5-S1: Moderate facet hypertrophy is present bilaterally. A mild broad-based disc protrusion is noted.  This results in mild right subarticular and foraminal stenosis. IMPRESSION: 1. Advanced facet hypertrophy and 7 mm grade 1 anterolisthesis at L4-5. 2. Uncovering of a broad-based disc protrusion and facet spurring at L4-5 leads to moderate subarticular narrowing bilaterally. 3. Mild foraminal narrowing at L4-5 is worse on the right. 4. Mild right subarticular and foraminal narrowing at L5-S1. 5. Facet hypertrophy at L3-4 without focal disc protrusion. Electronically Signed   By:  Marin Roberts M.D.   On: 04/01/2015 20:59   I have personally reviewed and evaluated these images and lab results as part of my medical decision-making.   EKG Interpretation None       MDM   Final diagnoses:  Back pain   Patient is a 59 year old female with a past medical history significant for asthma, hypertension, diabetes, morbid obesity, hypertension, who presents to the emergency department with lower back pain with paresthesias of the b/l lower extremities. On arrival, in obvious distress, not ill appearing. Afebrile, hemodynamically stable. Exam as above, notable for benign abdominal exam, intact distal pulses, normal neurologic exam, negative straight leg raise.   Given IV pain medication and antiemetics.   Labs including CBC, CMP were unremarkable. UA showed no signs of infection. CXR showed no acute findings. Given the patient's new onset lower back pain with paresthesias will obtain MRI lumbar spine to rule out epidural compression syndromes and neoplasm.  MRI showed no epidural compression syndromes or neoplasm, showed narrowing and disc protrusion.   Discussed symptomatic treatment with the patient. Discussed NSAIDs/acetaminophen and given a prescription for steroids for 1 weeks. Discussed with the patient that she should follow up with her PCP in the next week for re-evaluation of her back pain. Discussed strict return to the emergency department. Patient expressed understanding, no questions or concerns at time of discharge.     Corena Herter, MD 04/02/15 1610  Eber Hong, MD 04/05/15 [phone]

## 2016-08-09 ENCOUNTER — Encounter (HOSPITAL_COMMUNITY): Payer: Self-pay | Admitting: Cardiology

## 2016-08-09 ENCOUNTER — Emergency Department (HOSPITAL_COMMUNITY): Payer: PRIVATE HEALTH INSURANCE

## 2016-08-09 ENCOUNTER — Emergency Department (HOSPITAL_COMMUNITY)
Admission: EM | Admit: 2016-08-09 | Discharge: 2016-08-09 | Disposition: A | Discharge status: Home / Self Care | Payer: PRIVATE HEALTH INSURANCE | Attending: Emergency Medicine | Admitting: Emergency Medicine

## 2016-08-09 DIAGNOSIS — Z79899 Other long term (current) drug therapy: Secondary | ICD-10-CM | POA: Insufficient documentation

## 2016-08-09 DIAGNOSIS — Z7982 Long term (current) use of aspirin: Secondary | ICD-10-CM | POA: Insufficient documentation

## 2016-08-09 DIAGNOSIS — I10 Essential (primary) hypertension: Secondary | ICD-10-CM | POA: Insufficient documentation

## 2016-08-09 DIAGNOSIS — Z7984 Long term (current) use of oral hypoglycemic drugs: Secondary | ICD-10-CM | POA: Insufficient documentation

## 2016-08-09 DIAGNOSIS — E119 Type 2 diabetes mellitus without complications: Secondary | ICD-10-CM | POA: Insufficient documentation

## 2016-08-09 DIAGNOSIS — J4521 Mild intermittent asthma with (acute) exacerbation: Secondary | ICD-10-CM

## 2016-08-09 LAB — CBC
HCT: 39.1 % (ref 36.0–46.0)
Hemoglobin: 12.4 g/dL (ref 12.0–15.0)
MCH: 27.9 pg (ref 26.0–34.0)
MCHC: 31.7 g/dL (ref 30.0–36.0)
MCV: 88.1 fL (ref 78.0–100.0)
PLATELETS: 293 10*3/uL (ref 150–400)
RBC: 4.44 MIL/uL (ref 3.87–5.11)
RDW: 14 % (ref 11.5–15.5)
WBC: 12.1 10*3/uL — ABNORMAL HIGH (ref 4.0–10.5)

## 2016-08-09 LAB — BASIC METABOLIC PANEL
Anion gap: 7 (ref 5–15)
BUN: 12 mg/dL (ref 6–20)
CHLORIDE: 104 mmol/L (ref 101–111)
CO2: 28 mmol/L (ref 22–32)
CREATININE: 0.42 mg/dL — AB (ref 0.44–1.00)
Calcium: 9.2 mg/dL (ref 8.9–10.3)
GFR calc Af Amer: >60 mL/min (ref >60)
GFR calc non Af Amer: >60 mL/min (ref >60)
Glucose, Bld: 97 mg/dL (ref 65–99)
Potassium: 3.5 mmol/L (ref 3.5–5.1)
Sodium: 139 mmol/L (ref 135–145)

## 2016-08-09 LAB — TROPONIN I
Troponin I: <0.03 ng/mL (ref <0.03)
Troponin I: <0.03 ng/mL (ref <0.03)

## 2016-08-09 LAB — BRAIN NATRIURETIC PEPTIDE: B Natriuretic Peptide: 14 pg/mL (ref 0.0–100.0)

## 2016-08-09 MED ORDER — DEXAMETHASONE SODIUM PHOSPHATE 4 MG/ML IJ SOLN
12.0000 mg | Freq: Once | INTRAMUSCULAR | Status: AC
  Administered 2016-08-09: 12 mg via INTRAVENOUS
  Filled 2016-08-09: qty 3

## 2016-08-09 MED ORDER — ALBUTEROL SULFATE (2.5 MG/3ML) 0.083% IN NEBU: 2.5000 mg | INHALATION_SOLUTION | Freq: Four times a day (QID) | RESPIRATORY_TRACT | 12 refills | Status: AC | PRN

## 2016-08-09 MED ORDER — ALBUTEROL SULFATE HFA 108 (90 BASE) MCG/ACT IN AERS
6.0000 | INHALATION_SPRAY | Freq: Once | RESPIRATORY_TRACT | Status: AC
  Administered 2016-08-09: 6 via RESPIRATORY_TRACT
  Filled 2016-08-09: qty 6.7

## 2016-08-09 MED ORDER — DEXAMETHASONE 6 MG PO TABS: 12.0000 mg | ORAL_TABLET | Freq: Once | ORAL | 0 refills | Status: DC | PRN

## 2016-08-09 MED ORDER — IPRATROPIUM-ALBUTEROL 0.5-2.5 (3) MG/3ML IN SOLN
3.0000 mL | Freq: Once | RESPIRATORY_TRACT | Status: AC
  Administered 2016-08-09: 3 mL via RESPIRATORY_TRACT
  Filled 2016-08-09: qty 3

## 2016-08-09 NOTE — ED Provider Notes (Signed)
[facility]PT Provider Note   CSN: [id number] Arrival date & time: 08/09/16  1710     History   Chief Complaint Chief Complaint  Patient presents with  . Chest Pain    HPI Tiffany Boone is a 61 y.o. female.  HPI  Patient spanish speaking. Offered interpreter, but patient is fine with her son translating. 61 year old female who presents with shortness of breath and chest pressure. She has a history of asthma, hypertension, and diabetes. States that 1 hour prior to arrival, patient was in the car with her son and started complaining of chest tightness and pressure as well as shortness of breath and wheezing, gradually worsening. States that this feels somewhat like her asthma exacerbations. Unclear of what the trigger is, but the son reports that he does smoke cigarettes, and she may be exposed to secondhand smoke. Denies any fevers, cough, or sick contacts. Currently without any lower extremity edema or calf tenderness, prior history of DVT or PE, recent immobilization. He has had a sleep study done recently for some difficulty sleeping at nighttime due to some shortness of breath. She denies any alleviating factors but does note with activity or shortness of breath is increased today. Did not take any treatments prior to arrival. Past Medical History:  Diagnosis Date  . Asthma   . Diabetes mellitus without complication (HCC)   . Hypertension     Patient Active Problem List   Diagnosis Date Noted  . HYPERLIPIDEMIA 03/06/2009  . ALCOHOLISM 03/06/2009  . ASTHMA 03/06/2009  . GERD 03/06/2009  . CONSTIPATION 03/06/2009  . HEMATOCHEZIA 03/06/2009  . EXTERNAL HEMORRHOIDS 03/05/2009  . HEADACHE, CHRONIC 03/05/2009    History reviewed. No pertinent surgical history.  OB History    No data available       Home Medications    Prior to Admission medications   Medication Sig Start Date End Date Taking? Authorizing Provider  albuterol (PROVENTIL HFA;VENTOLIN HFA)  108 (90 Base) MCG/ACT inhaler Inhale 1-2 puffs into the lungs every 6 (six) hours as needed for wheezing or shortness of breath.   Yes Historical Provider, MD  aspirin EC 81 MG tablet Take 81 mg by mouth daily.   Yes Historical Provider, MD  lisinopril (PRINIVIL,ZESTRIL) 20 MG tablet Take 20 mg by mouth daily.   Yes Historical Provider, MD  meloxicam (MOBIC) 15 MG tablet Take 15 mg by mouth daily.   Yes Historical Provider, MD  METFORMIN HCL PO Take 1 tablet by mouth daily.   Yes Historical Provider, MD  omeprazole (PRILOSEC OTC) 20 MG tablet Take 20 mg by mouth daily.   Yes Historical Provider, MD  sertraline (ZOLOFT) 50 MG tablet Take 50 mg by mouth daily.   Yes Historical Provider, MD  simvastatin (ZOCOR) 20 MG tablet Take 20 mg by mouth daily.   Yes Historical Provider, MD  albuterol (PROVENTIL) (2.5 MG/3ML) 0.083% nebulizer solution Take 3 mLs (2.5 mg total) by nebulization every 6 (six) hours as needed for wheezing or shortness of breath. 08/09/16   Lavera Guise, MD  dexamethasone (DECADRON) 6 MG tablet Take 2 tablets (12 mg total) by mouth once as needed (if wheezing and short of breath in 3 days). 08/09/16   Lavera Guise, MD    Family History History reviewed. No pertinent family history.  Social History Social History  Substance Use Topics  . Smoking status: Never Smoker  . Smokeless tobacco: Not on file  . Alcohol use No     Allergies  Patient has no known allergies.   Review of Systems Review of Systems  Constitutional: Negative for fever.  HENT: Negative for congestion.   Respiratory: Positive for shortness of breath.   Cardiovascular: Positive for chest pain.  Gastrointestinal: Negative for vomiting.  Allergic/Immunologic: Negative for immunocompromised state.  Neurological: Negative for syncope.  Hematological: Does not bruise/bleed easily.  Psychiatric/Behavioral: Negative for confusion.  All other systems reviewed and are negative.    Physical Exam Updated  Vital Signs BP 108/81   Pulse 80   Temp 98.5 F (36.9 C) (Oral)   Resp 18   Ht  (1.753 m)   Wt 274 lb (124.3 kg)   SpO2 98%   BMI 40.46 kg/m   Physical Exam Physical Exam  Nursing note and vitals reviewed. Constitutional: Well developed, well nourished, non-toxic, and in no acute distress Head: Normocephalic and atraumatic.  Mouth/Throat: Oropharynx is clear and moist.  Neck: Normal range of motion. Neck supple.  Cardiovascular: Normal rate and regular rhythm.   Pulmonary/Chest: Effort normal. Diffuse expiratory wheezing throughout.  Abdominal: Soft. There is no tenderness. There is no rebound and no guarding.  Musculoskeletal: Normal range of motion. no edema. No calf tenderness. Neurological: Alert, no facial droop, fluent speech, moves all extremities symmetrically Skin: Skin is warm and dry.  Psychiatric: Cooperative   ED Treatments / Results  Labs (all labs ordered are listed, but only abnormal results are displayed) Labs Reviewed  BASIC METABOLIC PANEL - Abnormal; Notable for the following:       Result Value   Creatinine, Ser 0.42 (*)    All other components within normal limits  CBC - Abnormal; Notable for the following:    WBC 12.1 (*)    All other components within normal limits  TROPONIN I  BRAIN NATRIURETIC PEPTIDE  TROPONIN I    EKG  EKG Interpretation  Date/Time:  Tuesday August 09 2016 17:26:36 EDT Ventricular Rate:  87 PR Interval:    QRS Duration: 92 QT Interval:  363 QTC Calculation: 437 R Axis:   25 Text Interpretation:  Sinus rhythm Borderline short PR interval Abnormal R-wave progression, early transition Baseline wander in lead(s) V3 V4 V5 V6 Similar to prior. No STEMI.  Confirmed by LONG MD, JOSHUA [phone]) on 08/09/2016 5:36:46 PM       Radiology Dg Chest 2 View  Result Date: 08/09/2016 CLINICAL DATA:  Chest pain radiating to the back with dyspnea and dizziness. EXAM: CHEST  2 VIEW COMPARISON:  04/01/2015 CXR FINDINGS: The heart  size and mediastinal contours are within normal limits. Aortic atherosclerosis at the arch without aneurysm. There is mild uncoiling of the thoracic aorta. Chronic mild interstitial prominence with left upper lobe granuloma appear stable. No pneumonic consolidation, effusion or pneumothorax. The visualized skeletal structures are unremarkable. IMPRESSION: No active cardiopulmonary disease. Mild interstitial prominence bilaterally as before which may be seen chronic bronchitic change. Stable left upper lobe calcified nodule consistent with a granuloma. Electronically Signed   By: Tollie Eth M.D.   On: 08/09/2016 18:49    Procedures Procedures (including critical care time)  Medications Ordered in ED Medications  albuterol (PROVENTIL HFA;VENTOLIN HFA) 108 (90 Base) MCG/ACT inhaler 6 puff (not administered)  ipratropium-albuterol (DUONEB) 0.5-2.5 (3) MG/3ML nebulizer solution 3 mL (3 mLs Nebulization Given 08/09/16 1808)  dexamethasone (DECADRON) injection 12 mg (12 mg Intravenous Given 08/09/16 1811)     Initial Impression / Assessment and Plan / ED Course  I have reviewed the triage vital signs and the  nursing notes.  Pertinent labs & imaging results that were available during my care of the patient were reviewed by me and considered in my medical decision making (see chart for details).     Presents with shortness of breath and chest pressure. Seems consistent with acute asthma exacerbation as significant expiratory wheezing noted on exam. She is not toxic and in no severe respiratory distress. On room air with normal oxygenation.   Given decadron and duoneb with resolved wheezing and symptoms of chest pressure and dyspnea.   CXR visualized and without signs of pneumonia, edema, or other acute processes. Normal BNP and appears euvolemic. Unlikely new onset CHF symptoms. Some risk factors for ACS, but given symptoms resolved after treatment for asthma, less likely to be ACS. Serial troponins  are normal and her EKG is non-ischemic.  Ambulated in ED without hypoxia or significant distress. Felt stable for discharge home for continued outpatient treatment. Strict return and follow-up instructions reviewed. She and her son expressed understanding of all discharge instructions and felt comfortable with the plan of care.   Final Clinical Impressions(s) / ED Diagnoses   Final diagnoses:  Mild intermittent asthma with acute exacerbation    New Prescriptions New Prescriptions   ALBUTEROL (PROVENTIL) (2.5 MG/3ML) 0.083% NEBULIZER SOLUTION    Take 3 mLs (2.5 mg total) by nebulization every 6 (six) hours as needed for wheezing or shortness of breath.   DEXAMETHASONE (DECADRON) 6 MG TABLET    Take 2 tablets (12 mg total) by mouth once as needed (if wheezing and short of breath in 3 days).     Lavera Guise, MD 08/09/16 2136

## 2016-08-09 NOTE — ED Triage Notes (Signed)
Sob , chest pain that radiates through to back.  Started this morning.

## 2016-08-09 NOTE — Discharge Instructions (Signed)
Your symptoms today are due to an asthma attack.  Continue to give yourself a breathing treatment every 4 hours for the first day. Then give it to yourself as needed.   If still short of breath or wheezing in 3 days, take the prescribed dose of decadron.  Return for worsening symptoms, including fever, worsening pain or shortness of breath, confusion or any other symptoms concerning to you.

## 2016-08-09 NOTE — ED Triage Notes (Signed)
Pt reports pain in center of chest radiating to back x1 hour with sob, dizziness.

## 2016-08-09 NOTE — ED Notes (Signed)
Pt felt sob while ambulating. O2 stayed at 98%.

## 2016-08-09 NOTE — ED Notes (Signed)
Pt ambulatory to waiting room. Pt acknowledged understanding of discharge instructions.

## 2017-01-29 ENCOUNTER — Encounter (HOSPITAL_COMMUNITY): Payer: Self-pay

## 2017-01-29 ENCOUNTER — Observation Stay (HOSPITAL_COMMUNITY)
Admission: EM | Admit: 2017-01-29 | Discharge: 2017-01-31 | Disposition: A | Discharge status: Home / Self Care | Payer: PRIVATE HEALTH INSURANCE | Attending: Family Medicine | Admitting: Family Medicine

## 2017-01-29 ENCOUNTER — Emergency Department (HOSPITAL_COMMUNITY): Payer: PRIVATE HEALTH INSURANCE

## 2017-01-29 DIAGNOSIS — R531 Weakness: Secondary | ICD-10-CM

## 2017-01-29 DIAGNOSIS — R0789 Other chest pain: Secondary | ICD-10-CM

## 2017-01-29 DIAGNOSIS — Z79899 Other long term (current) drug therapy: Secondary | ICD-10-CM | POA: Insufficient documentation

## 2017-01-29 DIAGNOSIS — G4733 Obstructive sleep apnea (adult) (pediatric): Secondary | ICD-10-CM | POA: Insufficient documentation

## 2017-01-29 DIAGNOSIS — R202 Paresthesia of skin: Principal | ICD-10-CM | POA: Insufficient documentation

## 2017-01-29 DIAGNOSIS — N390 Urinary tract infection, site not specified: Secondary | ICD-10-CM | POA: Diagnosis present

## 2017-01-29 DIAGNOSIS — R079 Chest pain, unspecified: Secondary | ICD-10-CM | POA: Diagnosis present

## 2017-01-29 DIAGNOSIS — E119 Type 2 diabetes mellitus without complications: Secondary | ICD-10-CM

## 2017-01-29 DIAGNOSIS — G8929 Other chronic pain: Secondary | ICD-10-CM | POA: Diagnosis present

## 2017-01-29 DIAGNOSIS — I10 Essential (primary) hypertension: Secondary | ICD-10-CM | POA: Diagnosis present

## 2017-01-29 DIAGNOSIS — Z7982 Long term (current) use of aspirin: Secondary | ICD-10-CM | POA: Insufficient documentation

## 2017-01-29 DIAGNOSIS — K219 Gastro-esophageal reflux disease without esophagitis: Secondary | ICD-10-CM | POA: Diagnosis present

## 2017-01-29 DIAGNOSIS — R519 Headache, unspecified: Secondary | ICD-10-CM | POA: Diagnosis present

## 2017-01-29 DIAGNOSIS — IMO0002 Reserved for concepts with insufficient information to code with codable children: Secondary | ICD-10-CM | POA: Diagnosis present

## 2017-01-29 DIAGNOSIS — R51 Headache: Secondary | ICD-10-CM

## 2017-01-29 DIAGNOSIS — R200 Anesthesia of skin: Secondary | ICD-10-CM | POA: Diagnosis present

## 2017-01-29 DIAGNOSIS — R000 Tachycardia, unspecified: Secondary | ICD-10-CM

## 2017-01-29 DIAGNOSIS — D72829 Elevated white blood cell count, unspecified: Secondary | ICD-10-CM

## 2017-01-29 DIAGNOSIS — J45909 Unspecified asthma, uncomplicated: Secondary | ICD-10-CM | POA: Diagnosis present

## 2017-01-29 DIAGNOSIS — F1090 Alcohol use, unspecified, uncomplicated: Secondary | ICD-10-CM | POA: Diagnosis present

## 2017-01-29 DIAGNOSIS — G43909 Migraine, unspecified, not intractable, without status migrainosus: Secondary | ICD-10-CM | POA: Insufficient documentation

## 2017-01-29 LAB — URINALYSIS, ROUTINE W REFLEX MICROSCOPIC
BILIRUBIN URINE: NEGATIVE
GLUCOSE, UA: NEGATIVE mg/dL
HGB URINE DIPSTICK: NEGATIVE
Ketones, ur: NEGATIVE mg/dL
NITRITE: POSITIVE — AB
PROTEIN: 100 mg/dL — AB
SPECIFIC GRAVITY, URINE: 1.021 (ref 1.005–1.030)
pH: 5 (ref 5.0–8.0)

## 2017-01-29 LAB — I-STAT TROPONIN, ED
TROPONIN I, POC: 0 ng/mL (ref 0.00–0.08)
Troponin i, poc: 0 ng/mL (ref 0.00–0.08)

## 2017-01-29 LAB — CBC
HEMATOCRIT: 37.6 % (ref 36.0–46.0)
HEMOGLOBIN: 12.5 g/dL (ref 12.0–15.0)
MCH: 29.4 pg (ref 26.0–34.0)
MCHC: 33.2 g/dL (ref 30.0–36.0)
MCV: 88.5 fL (ref 78.0–100.0)
Platelets: 348 10*3/uL (ref 150–400)
RBC: 4.25 MIL/uL (ref 3.87–5.11)
RDW: 13.8 % (ref 11.5–15.5)
WBC: 14.2 10*3/uL — AB (ref 4.0–10.5)

## 2017-01-29 LAB — BASIC METABOLIC PANEL
ANION GAP: 13 (ref 5–15)
BUN: 12 mg/dL (ref 6–20)
CHLORIDE: 102 mmol/L (ref 101–111)
CO2: 24 mmol/L (ref 22–32)
CREATININE: 0.63 mg/dL (ref 0.44–1.00)
Calcium: 9.2 mg/dL (ref 8.9–10.3)
GFR calc non Af Amer: >60 mL/min (ref >60)
Glucose, Bld: 175 mg/dL — ABNORMAL HIGH (ref 65–99)
Potassium: 3.8 mmol/L (ref 3.5–5.1)
SODIUM: 139 mmol/L (ref 135–145)

## 2017-01-29 LAB — ETHANOL: Alcohol, Ethyl (B): <5 mg/dL (ref <10)

## 2017-01-29 LAB — CBG MONITORING, ED: Glucose-Capillary: 158 mg/dL — ABNORMAL HIGH (ref 65–99)

## 2017-01-29 MED ORDER — FAMOTIDINE 20 MG PO TABS
20.0000 mg | ORAL_TABLET | Freq: Once | ORAL | Status: AC
  Administered 2017-01-29: 20 mg via ORAL
  Filled 2017-01-29: qty 1

## 2017-01-29 MED ORDER — DEXTROSE 5 % IV SOLN
1.0000 g | Freq: Once | INTRAVENOUS | Status: AC
  Administered 2017-01-29: 1 g via INTRAVENOUS
  Filled 2017-01-29: qty 10

## 2017-01-29 MED ORDER — ALUM & MAG HYDROXIDE-SIMETH 200-200-20 MG/5ML PO SUSP
15.0000 mL | Freq: Once | ORAL | Status: AC
  Administered 2017-01-29: 15 mL via ORAL
  Filled 2017-01-29: qty 30

## 2017-01-29 MED ORDER — SODIUM CHLORIDE 0.9 % IV BOLUS (SEPSIS)
1000.0000 mL | Freq: Once | INTRAVENOUS | Status: AC
  Administered 2017-01-29: 1000 mL via INTRAVENOUS

## 2017-01-29 MED ORDER — LORAZEPAM 2 MG/ML IJ SOLN
0.5000 mg | Freq: Once | INTRAMUSCULAR | Status: AC
  Administered 2017-01-29: 0.5 mg via INTRAVENOUS
  Filled 2017-01-29: qty 1

## 2017-01-29 MED ORDER — DIPHENHYDRAMINE HCL 50 MG/ML IJ SOLN
12.5000 mg | Freq: Once | INTRAMUSCULAR | Status: AC
  Administered 2017-01-29: 12.5 mg via INTRAVENOUS
  Filled 2017-01-29: qty 1

## 2017-01-29 MED ORDER — METOCLOPRAMIDE HCL 5 MG/ML IJ SOLN
10.0000 mg | Freq: Once | INTRAMUSCULAR | Status: AC
  Administered 2017-01-29: 10 mg via INTRAVENOUS
  Filled 2017-01-29: qty 2

## 2017-01-29 MED ORDER — HYDROMORPHONE HCL 1 MG/ML IJ SOLN
0.5000 mg | Freq: Once | INTRAMUSCULAR | Status: AC
  Administered 2017-01-29: 0.5 mg via INTRAVENOUS
  Filled 2017-01-29: qty 1

## 2017-01-29 NOTE — ED Triage Notes (Signed)
Patient brought in by family. Reports patient was alert and oriented at 1000 this morning. Patients son states he got home at 1700 and patient was confused. Patient complains of dizziness, headache and tingling/numbness to mouth and right side of face. Patient is no at baseline mentation wise per family.

## 2017-01-29 NOTE — ED Notes (Signed)
Pt denies pain at this time just numbness in the right side of her face and mouth.

## 2017-01-29 NOTE — ED Provider Notes (Signed)
[facility] Provider Note   CSN: [id number] Arrival date & time: 01/29/17  1726     History   Chief Complaint Chief Complaint  Patient presents with  . Numbness    HPI Tiffany Boone is a 61 y.o. female.  Patient w hx migraines, c/o headache intermittently in past week.  Moderate. Persistent. Today son indicates c/o increased frontal headache, c/o generally feeling weak, nauseated, numbness/tingling about mouth/face, and then on arrival to ED also c/o mid chest pain. Mid chest, dull.  Headaches gradual onset, intermittent, has hx migraines. No neck pain, pain is frontal/diffuse. No sore throat or runny nose. No cough. No sob. Nausea all day, No vomiting. No syncope, trauma or fall.  Son indicates pt was able to go to [facility] for day today, despite headache. ?urinary frequency.    The history is provided by the patient and a relative. A language interpreter was used.    Past Medical History:  Diagnosis Date  . Asthma   . Diabetes mellitus without complication (HCC)   . Hypertension     Patient Active Problem List   Diagnosis Date Noted  . HYPERLIPIDEMIA 03/06/2009  . ALCOHOLISM 03/06/2009  . ASTHMA 03/06/2009  . GERD 03/06/2009  . CONSTIPATION 03/06/2009  . HEMATOCHEZIA 03/06/2009  . EXTERNAL HEMORRHOIDS 03/05/2009  . HEADACHE, CHRONIC 03/05/2009    History reviewed. No pertinent surgical history.  OB History    No data available       Home Medications    Prior to Admission medications   Medication Sig Start Date End Date Taking? Authorizing Provider  albuterol (PROVENTIL HFA;VENTOLIN HFA) 108 (90 Base) MCG/ACT inhaler Inhale 1-2 puffs into the lungs every 6 (six) hours as needed for wheezing or shortness of breath.    [provider]  albuterol (PROVENTIL) (2.5 MG/3ML) 0.083% nebulizer solution Take 3 mLs (2.5 mg total) by nebulization every 6 (six) hours as needed for wheezing or shortness of breath. 08/09/16   Lavera Guise, MD    aspirin EC 81 MG tablet Take 81 mg by mouth daily.    [provider]  dexamethasone (DECADRON) 6 MG tablet Take 2 tablets (12 mg total) by mouth once as needed (if wheezing and short of breath in 3 days). 08/09/16   Lavera Guise, MD  lisinopril (PRINIVIL,ZESTRIL) 20 MG tablet Take 20 mg by mouth daily.    [provider]  meloxicam (MOBIC) 15 MG tablet Take 15 mg by mouth daily.    [provider]  METFORMIN HCL PO Take 1 tablet by mouth daily.    [provider]  omeprazole (PRILOSEC OTC) 20 MG tablet Take 20 mg by mouth daily.    [provider]  sertraline (ZOLOFT) 50 MG tablet Take 50 mg by mouth daily.    [provider]  simvastatin (ZOCOR) 20 MG tablet Take 20 mg by mouth daily.    [provider]    Family History No family history on file.  Social History Social History  Substance Use Topics  . Smoking status: Never Smoker  . Smokeless tobacco: Never Used  . Alcohol use No     Allergies   Patient has no known allergies.   Review of Systems Review of Systems  Constitutional: Negative for fever.  HENT: Negative for sinus pain and sore throat.   Eyes: Negative for pain and visual disturbance.  Respiratory: Negative for shortness of breath.   Cardiovascular: Negative for chest pain.  Gastrointestinal: Positive for nausea. Negative  for abdominal pain.  Genitourinary: Negative for flank pain.  Musculoskeletal: Negative for back pain and neck pain.  Skin: Negative for rash.  Neurological: Positive for weakness, numbness and headaches. Negative for speech difficulty.       General weakness  Hematological: Does not bruise/bleed easily.  Psychiatric/Behavioral: Negative for confusion.     Physical Exam Updated Vital Signs BP (!) 133/93 (BP Location: Right Arm)   Pulse (!) 114   Temp 98.4 F (36.9 C) (Oral)   Resp 20   Ht 1.727 m ( )   Wt 136.1 kg (300 lb)   SpO2 97%   BMI 45.61 kg/m    Physical Exam  Constitutional: She is oriented to person, place, and time. She appears well-developed and well-nourished. No distress.  HENT:  Head: Atraumatic.  Nose: Nose normal.  Mouth/Throat: Oropharynx is clear and moist.  No sinus or temporal tenderness.  Eyes: Pupils are equal, round, and reactive to light. Conjunctivae and EOM are normal. No scleral icterus.  Neck: Normal range of motion. Neck supple. No tracheal deviation present. No thyromegaly present.  No stiffness or rigidity.   Cardiovascular: Regular rhythm, normal heart sounds and intact distal pulses.  Exam reveals no gallop and no friction rub.   No murmur heard. Tachycardic.   Pulmonary/Chest: Effort normal and breath sounds normal. No respiratory distress.  Abdominal: Soft. Normal appearance and bowel sounds are normal. She exhibits no distension. There is no tenderness.  Genitourinary:  Genitourinary Comments: No cva tenderness.  Musculoskeletal: Normal range of motion. She exhibits no edema or tenderness.  Neurological: She is alert and oriented to person, place, and time. No cranial nerve deficit.  Speech clear, fluent, via interpreter. No facial asymmetry or droop.  No pronator drift. Motor intact bil, stre 5/5. sens grossly intact.  Skin: Skin is warm and dry. No rash noted. She is not diaphoretic.  Psychiatric: She has a normal mood and affect.  Nursing note and vitals reviewed.    ED Treatments / Results  Labs (all labs ordered are listed, but only abnormal results are displayed) Results for orders placed or performed during the hospital encounter of 01/29/17  CBC  Result Value Ref Range   WBC 14.2 (H) 4.0 - 10.5 K/uL   RBC 4.25 3.87 - 5.11 MIL/uL   Hemoglobin 12.5 12.0 - 15.0 g/dL   HCT 16.1 09.6 - 04.5 %   MCV 88.5 78.0 - 100.0 fL   MCH 29.4 26.0 - 34.0 pg   MCHC 33.2 30.0 - 36.0 g/dL   RDW 40.9 81.1 - 91.4 %   Platelets 348 150 - 400 K/uL  Basic metabolic panel  Result Value Ref Range    Sodium 139 135 - 145 mmol/L   Potassium 3.8 3.5 - 5.1 mmol/L   Chloride 102 101 - 111 mmol/L   CO2 24 22 - 32 mmol/L   Glucose, Bld 175 (H) 65 - 99 mg/dL   BUN 12 6 - 20 mg/dL   Creatinine, Ser 7.82 0.44 - 1.00 mg/dL   Calcium 9.2 8.9 - 95.6 mg/dL   GFR calc non Af Amer >60 >60 mL/min   GFR calc Af Amer >60 >60 mL/min   Anion gap 13 5 - 15  Urinalysis, Routine w reflex microscopic  Result Value Ref Range   Color, Urine YELLOW YELLOW   APPearance CLOUDY (A) CLEAR   Specific Gravity, Urine 1.021 1.005 - 1.030   pH 5.0 5.0 - 8.0   Glucose, UA NEGATIVE NEGATIVE mg/dL  Hgb urine dipstick NEGATIVE NEGATIVE   Bilirubin Urine NEGATIVE NEGATIVE   Ketones, ur NEGATIVE NEGATIVE mg/dL   Protein, ur 161 (A) NEGATIVE mg/dL   Nitrite POSITIVE (A) NEGATIVE   Leukocytes, UA MODERATE (A) NEGATIVE   RBC / HPF 0-5 0 - 5 RBC/hpf   WBC, UA TOO NUMEROUS TO COUNT 0 - 5 WBC/hpf   Bacteria, UA FEW (A) NONE SEEN   Squamous Epithelial / LPF 6-30 (A) NONE SEEN   Mucus PRESENT   CBG monitoring, ED  Result Value Ref Range   Glucose-Capillary 158 (H) 65 - 99 mg/dL  I-stat troponin, ED  Result Value Ref Range   Troponin i, poc 0.00 0.00 - 0.08 ng/mL   Comment 3            EKG  EKG Interpretation None       Radiology No results found.  Procedures Procedures (including critical care time)  Medications Ordered in ED Medications  metoCLOPramide (REGLAN) injection 10 mg (not administered)  LORazepam (ATIVAN) injection 0.5 mg (not administered)     Initial Impression / Assessment and Plan / ED Course  I have reviewed the triage vital signs and the nursing notes.  Pertinent labs & imaging results that were available during my care of the patient were reviewed by me and considered in my medical decision making (see chart for details).  Iv ns. Labs. Ct.  reglan iv.   Pt appears mildly anxious, ativan . 5 mg.   Reviewed nursing notes and prior charts for additional history.   Pt  generally weak, nausea. Repeat temp, afeb.   Patient remains tachycardic, w ambulating hr 130. Poor appetite. ua is positive. u cx sent. Rocephin iv.  Iv ns bolus.  No cp currently. No focal numbness. Pt notes earlier numbness improved.   Given cp, repeat trop sent/pending.    Hospitalist consulted for admission re weakness, uti, tachy, and given recent atypical cp may need repeat cardiac markers.    Final Clinical Impressions(s) / ED Diagnoses   Final diagnoses:  None    New Prescriptions New Prescriptions   No medications on file     Cathren Laine, MD 01/29/17 2158

## 2017-01-29 NOTE — ED Notes (Addendum)
Tiffany Boone-[id number]

## 2017-01-29 NOTE — ED Notes (Signed)
Pt states that the numbness has decreased some in her face and head.

## 2017-01-30 ENCOUNTER — Observation Stay (HOSPITAL_COMMUNITY): Payer: PRIVATE HEALTH INSURANCE

## 2017-01-30 ENCOUNTER — Encounter (HOSPITAL_COMMUNITY): Payer: Self-pay | Admitting: Family Medicine

## 2017-01-30 ENCOUNTER — Observation Stay (HOSPITAL_BASED_OUTPATIENT_CLINIC_OR_DEPARTMENT_OTHER): Payer: PRIVATE HEALTH INSURANCE

## 2017-01-30 DIAGNOSIS — N390 Urinary tract infection, site not specified: Secondary | ICD-10-CM | POA: Diagnosis not present

## 2017-01-30 DIAGNOSIS — R531 Weakness: Secondary | ICD-10-CM | POA: Diagnosis not present

## 2017-01-30 DIAGNOSIS — R9431 Abnormal electrocardiogram [ECG] [EKG]: Secondary | ICD-10-CM | POA: Diagnosis not present

## 2017-01-30 DIAGNOSIS — R519 Headache, unspecified: Secondary | ICD-10-CM | POA: Insufficient documentation

## 2017-01-30 DIAGNOSIS — R0789 Other chest pain: Secondary | ICD-10-CM | POA: Diagnosis not present

## 2017-01-30 DIAGNOSIS — R51 Headache: Secondary | ICD-10-CM

## 2017-01-30 DIAGNOSIS — I10 Essential (primary) hypertension: Secondary | ICD-10-CM | POA: Diagnosis not present

## 2017-01-30 LAB — ECHOCARDIOGRAM COMPLETE
AV Area VTI: 2.04 cm2
AV Area mean vel: 2.29 cm2
AV VEL mean LVOT/AV: 0.9
AV area mean vel ind: 0.93 cm2/m2
AV peak Index: 0.83
AV pk vel: 205 cm/s
AV vel: 2.21
AVA: 2.21 cm2
AVAREAVTIIND: 0.9 cm2/m2
AVG: 8 mmHg
AVPG: 17 mmHg
Ao pk vel: 0.8 m/s
CHL CUP AV VALUE AREA INDEX: 0.9
CHL CUP MV DEC (S): 352
CHL CUP STROKE VOLUME: 51 mL
DOP CAL AO MEAN VELOCITY: 123 cm/s
EERAT: 8.35
EWDT: 352 ms
FS: 42 % (ref 28–44)
Height: 68 in
IVS/LV PW RATIO, ED: 0.96
LA ID, A-P, ES: 28 mm
LA diam end sys: 28 mm
LA diam index: 1.14 cm/m2
LAVOLA4C: 41.4 mL
LV E/e'average: 8.35
LV PW d: 10.4 mm — AB (ref 0.6–1.1)
LV TDI E'LATERAL: 11.9
LV TDI E'MEDIAL: 8.81
LV dias vol index: 28 mL/m2
LV sys vol: 18 mL
LVDIAVOL: 69 mL (ref 46–106)
LVEEMED: 8.35
LVELAT: 11.9 cm/s
LVOT VTI: 33 cm
LVOT area: 2.54 cm2
LVOT diameter: 18 mm
LVOT peak VTI: 0.87 cm
LVOT peak grad rest: 11 mmHg
LVOT peak vel: 165 cm/s
LVOTSV: 84 mL
LVSYSVOLIN: 7 mL/m2
Lateral S' vel: 12.1 cm/s
MV pk A vel: 119 m/s
MV pk E vel: 99.4 m/s
MVPG: 4 mmHg
RV TAPSE: 21.2 mm
RV sys press: 31 mmHg
Reg peak vel: 266 cm/s
Simpson's disk: 74
TR max vel: 266 cm/s
VTI: 37.9 cm
Weight: 4250.47 oz

## 2017-01-30 LAB — CBC
HEMATOCRIT: 35 % — AB (ref 36.0–46.0)
Hemoglobin: 11.3 g/dL — ABNORMAL LOW (ref 12.0–15.0)
MCH: 29 pg (ref 26.0–34.0)
MCHC: 32.3 g/dL (ref 30.0–36.0)
MCV: 89.7 fL (ref 78.0–100.0)
PLATELETS: 301 10*3/uL (ref 150–400)
RBC: 3.9 MIL/uL (ref 3.87–5.11)
RDW: 13.9 % (ref 11.5–15.5)
WBC: 10.7 10*3/uL — AB (ref 4.0–10.5)

## 2017-01-30 LAB — RAPID URINE DRUG SCREEN, HOSP PERFORMED
Amphetamines: NOT DETECTED
BENZODIAZEPINES: NOT DETECTED
Barbiturates: NOT DETECTED
Cocaine: NOT DETECTED
Opiates: NOT DETECTED
Tetrahydrocannabinol: NOT DETECTED

## 2017-01-30 LAB — TSH: TSH: 2.243 u[IU]/mL (ref 0.350–4.500)

## 2017-01-30 LAB — LIPID PANEL
CHOLESTEROL: 112 mg/dL (ref 0–200)
HDL: 44 mg/dL (ref >40)
LDL Cholesterol: 50 mg/dL (ref 0–99)
Total CHOL/HDL Ratio: 2.5 RATIO
Triglycerides: 92 mg/dL (ref <150)
VLDL: 18 mg/dL (ref 0–40)

## 2017-01-30 LAB — GLUCOSE, CAPILLARY
GLUCOSE-CAPILLARY: 143 mg/dL — AB (ref 65–99)
Glucose-Capillary: 155 mg/dL — ABNORMAL HIGH (ref 65–99)
Glucose-Capillary: 120 mg/dL — ABNORMAL HIGH (ref 65–99)
Glucose-Capillary: 130 mg/dL — ABNORMAL HIGH (ref 65–99)

## 2017-01-30 LAB — VITAMIN B12: Vitamin B-12: 451 pg/mL (ref 180–914)

## 2017-01-30 LAB — HEMOGLOBIN A1C
Hgb A1c MFr Bld: 6.9 % — ABNORMAL HIGH (ref 4.8–5.6)
Mean Plasma Glucose: 151.33 mg/dL

## 2017-01-30 LAB — LACTIC ACID, PLASMA: LACTIC ACID, VENOUS: 1.6 mmol/L (ref 0.5–1.9)

## 2017-01-30 LAB — TROPONIN I

## 2017-01-30 LAB — D-DIMER, QUANTITATIVE (NOT AT ARMC): D DIMER QUANT: 0.4 ug{FEU}/mL (ref 0.00–0.50)

## 2017-01-30 MED ORDER — ACETAMINOPHEN 325 MG PO TABS
650.0000 mg | ORAL_TABLET | ORAL | Status: DC | PRN
  Administered 2017-01-31: 650 mg via ORAL
  Filled 2017-01-30: qty 2

## 2017-01-30 MED ORDER — ALBUTEROL SULFATE (2.5 MG/3ML) 0.083% IN NEBU: 3.0000 mL | INHALATION_SOLUTION | Freq: Four times a day (QID) | RESPIRATORY_TRACT | Status: DC | PRN

## 2017-01-30 MED ORDER — PANTOPRAZOLE SODIUM 40 MG PO TBEC
40.0000 mg | DELAYED_RELEASE_TABLET | Freq: Two times a day (BID) | ORAL | Status: DC
Start: 2017-01-30 — End: 2017-01-31
  Administered 2017-01-30 – 2017-01-31 (×4): 40 mg via ORAL
  Filled 2017-01-30 (×4): qty 1

## 2017-01-30 MED ORDER — LISINOPRIL 10 MG PO TABS
20.0000 mg | ORAL_TABLET | Freq: Every day | ORAL | Status: DC
Start: 2017-01-30 — End: 2017-01-31
  Administered 2017-01-30 – 2017-01-31 (×2): 20 mg via ORAL
  Filled 2017-01-30 (×2): qty 2

## 2017-01-30 MED ORDER — MELOXICAM 15 MG PO TABS
15.0000 mg | ORAL_TABLET | Freq: Every day | ORAL | Status: DC
  Filled 2017-01-30: qty 1

## 2017-01-30 MED ORDER — DEXTROSE 5 % IV SOLN
1.0000 g | INTRAVENOUS | Status: DC
  Administered 2017-01-30: 1 g via INTRAVENOUS
  Filled 2017-01-30 (×2): qty 10

## 2017-01-30 MED ORDER — ASPIRIN EC 325 MG PO TBEC
325.0000 mg | DELAYED_RELEASE_TABLET | Freq: Every day | ORAL | Status: DC
  Administered 2017-01-30 – 2017-01-31 (×2): 325 mg via ORAL
  Filled 2017-01-30 (×2): qty 1

## 2017-01-30 MED ORDER — INSULIN ASPART 100 UNIT/ML ~~LOC~~ SOLN
0.0000 [IU] | Freq: Three times a day (TID) | SUBCUTANEOUS | Status: DC
  Administered 2017-01-30: 2 [IU] via SUBCUTANEOUS
  Administered 2017-01-30 – 2017-01-31 (×2): 3 [IU] via SUBCUTANEOUS
  Administered 2017-01-31: 2 [IU] via SUBCUTANEOUS

## 2017-01-30 MED ORDER — SODIUM CHLORIDE 0.9 % IV SOLN
INTRAVENOUS | Status: AC
  Administered 2017-01-30 (×2): via INTRAVENOUS

## 2017-01-30 MED ORDER — KETOROLAC TROMETHAMINE 30 MG/ML IJ SOLN
30.0000 mg | Freq: Four times a day (QID) | INTRAMUSCULAR | Status: DC | PRN
Start: 2017-01-30 — End: 2017-01-31
  Administered 2017-01-30: 30 mg via INTRAVENOUS
  Filled 2017-01-30: qty 1

## 2017-01-30 MED ORDER — SIMVASTATIN 20 MG PO TABS
20.0000 mg | ORAL_TABLET | Freq: Every day | ORAL | Status: DC
  Administered 2017-01-30 – 2017-01-31 (×2): 20 mg via ORAL
  Filled 2017-01-30 (×2): qty 1

## 2017-01-30 MED ORDER — GABAPENTIN 300 MG PO CAPS
300.0000 mg | ORAL_CAPSULE | Freq: Three times a day (TID) | ORAL | Status: DC
  Administered 2017-01-30 – 2017-01-31 (×4): 300 mg via ORAL
  Filled 2017-01-30 (×4): qty 1

## 2017-01-30 MED ORDER — ONDANSETRON HCL 4 MG/2ML IJ SOLN: 4.0000 mg | Freq: Four times a day (QID) | INTRAMUSCULAR | Status: DC | PRN

## 2017-01-30 MED ORDER — ALBUTEROL SULFATE (2.5 MG/3ML) 0.083% IN NEBU: 2.5000 mg | INHALATION_SOLUTION | Freq: Four times a day (QID) | RESPIRATORY_TRACT | Status: DC | PRN

## 2017-01-30 MED ORDER — DULOXETINE HCL 60 MG PO CPEP
120.0000 mg | ORAL_CAPSULE | Freq: Every day | ORAL | Status: DC
  Administered 2017-01-30 – 2017-01-31 (×2): 120 mg via ORAL
  Filled 2017-01-30 (×2): qty 2

## 2017-01-30 NOTE — Consult Note (Addendum)
Tiffany A. Merlene Laughter, MD     [email or web address]          Tiffany Boone is an 61 y.o. female.   ASSESSMENT/PLAN: 1. Multiple neurological symptoms of unclear etiology.  There are no localizing qualities or no single diagnosis a unified her symptoms.  The because of her age and risk factors however there are some concern for possible vertebrobasilar disease.  Consequently,  Head and neck CTA will be obtained.  Antiplatelet agent aspirin is recommended.  Continue with risk factor modification including weight loss, hypertension control and diabetes control.  Also continue with use of sleep apnea machine.  2.  Obstructive sleep apnea syndrome  3.  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  I recommend consideration for outpatient nerve conduction testing.  4.  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   The patient is a 61 year old [name] female who presents with acute onset of severe headaches involving the frontal occipital region.  These were associated with the numbness and tingling of the right side which is shifted to the left side.  Symptoms happened acutely and was also associated with periorbital numbness.  Interestingly, she reportedly has had a 3 day history of numbness and tingling of the hands bilaterally.    Patient does not report having focal weakness.  No problems with dysarthria or dysphagia.  No dizziness reported. This was not speak for living was and this limits the evaluation but the translation is obtained by her children.  Her son previously worked as a Doctor, hospital in this hospital.  It appears that the patient may have had some associated chest pain and with her symptoms.  She has a history of obstructive sleep apnea syndrome and is compliant with her CPAP machine per the family.   The review of systems otherwise negative.      GENERAL: This is a morbidly obese female in  no acute distress.  HEENT:   She has a large stocking neck and a large tongue with crowded posterior space.  ABDOMEN: soft  EXTREMITIES: No edema   BACK:   This is normal.  SKIN: Normal by inspection.    MENTAL STATUS:   Again, there is very little English that the patient speaks but overall appears to be alert and lucid.   No dysarthria appreciated.  CRANIAL NERVES: Pupils are equal, round and reactive to light and accomodation; extra ocular movements are full, there is no significant nystagmus; visual fields are full; upper and lower facial muscles are normal in strength and symmetric, there is no flattening of the nasolabial folds; tongue is midline; uvula is midline; shoulder elevation is normal.  MOTOR: Normal tone, bulk and strength; no pronator drift.  COORDINATION: Left finger to nose is normal, right finger to nose is normal, No rest tremor; no intention tremor; no postural tremor; no bradykinesia.  REFLEXES: Deep tendon reflexes are symmetrical and normal. Babinski reflexes are flexor bilaterally.   SENSATION: Normal to light touch, temperature, and pinprick.       Blood pressure 128/69, pulse 98, temperature 98 F (36.7 C), temperature source Oral, resp. rate 20, height '5\' 8"'  (1.727 m), weight 265 lb 10.5 oz (120.5 kg), SpO2 97 %.  Past Medical History:  Diagnosis Date  . Asthma   . Diabetes mellitus without complication ([place])   . Hypertension     History reviewed. No pertinent surgical history.  No family history on file.  Social History:  reports that she has never smoked. She has never used smokeless tobacco. She reports that she does not drink alcohol or use drugs.  Allergies: No Known Allergies  Medications: Prior to Admission medications   Medication Sig Start Date End Date Taking? Authorizing Provider  albuterol (PROVENTIL HFA;VENTOLIN HFA) 108 (90 Base) MCG/ACT inhaler Inhale 1-2 puffs into the lungs every 6 (six) hours as needed for wheezing or  shortness of breath.   Yes [provider]  albuterol (PROVENTIL) (2.5 MG/3ML) 0.083% nebulizer solution Take 3 mLs (2.5 mg total) by nebulization every 6 (six) hours as needed for wheezing or shortness of breath. 08/09/16  Yes Forde Dandy, MD  DULoxetine (CYMBALTA) 60 MG capsule Take 120 mg by mouth daily.   Yes [provider]  gabapentin (NEURONTIN) 300 MG capsule Take 300 mg by mouth 3 (three) times daily.   Yes [provider]  lisinopril (PRINIVIL,ZESTRIL) 20 MG tablet Take 20 mg by mouth daily.   Yes [provider]  meloxicam (MOBIC) 15 MG tablet Take 15 mg by mouth daily.   Yes [provider]  pantoprazole (PROTONIX) 40 MG tablet Take 40 mg by mouth 2 (two) times daily.   Yes [provider]  simvastatin (ZOCOR) 20 MG tablet Take 20 mg by mouth daily.   Yes [provider]    Scheduled Meds: . aspirin EC  325 mg Oral Daily  . DULoxetine  120 mg Oral Daily  . gabapentin  300 mg Oral TID  . insulin aspart  0-15 Units Subcutaneous TID WC  . lisinopril  20 mg Oral Daily  . pantoprazole  40 mg Oral BID  . simvastatin  20 mg Oral Daily   Continuous Infusions: . sodium chloride 100 mL/hr at 01/30/17 0906  . cefTRIAXone (ROCEPHIN)  IV     PRN Meds:.acetaminophen, albuterol, ketorolac, ondansetron (ZOFRAN) IV     Results for orders placed or performed during the hospital encounter of 01/29/17 (from the past 48 hour(s))  CBG monitoring, ED     Status: Abnormal   Collection Time: 01/29/17  5:34 PM  Result Value Ref Range   Glucose-Capillary 158 (H) 65 - 99 mg/dL  CBC     Status: Abnormal   Collection Time: 01/29/17  5:47 PM  Result Value Ref Range   WBC 14.2 (H) 4.0 - 10.5 K/uL   RBC 4.25 3.87 - 5.11 MIL/uL   Hemoglobin 12.5 12.0 - 15.0 g/dL   HCT 37.6 36.0 - 46.0 %   MCV 88.5 78.0 - 100.0 fL   MCH 29.4 26.0 - 34.0 pg   MCHC 33.2 30.0 - 36.0 g/dL   RDW 13.8 11.5 - 15.5 %   Platelets 348 150 - 400 K/uL  Basic  metabolic panel     Status: Abnormal   Collection Time: 01/29/17  5:47 PM  Result Value Ref Range   Sodium 139 135 - 145 mmol/L   Potassium 3.8 3.5 - 5.1 mmol/L   Chloride 102 101 - 111 mmol/L   CO2 24 22 - 32 mmol/L   Glucose, Bld 175 (H) 65 - 99 mg/dL   BUN 12 6 - 20 mg/dL   Creatinine, Ser 0.63 0.44 - 1.00 mg/dL   Calcium 9.2 8.9 - 10.3 mg/dL   GFR calc non Af Amer >60 >60 mL/min   GFR calc Af Amer >60 >60 mL/min    Comment: (NOTE) The eGFR has been calculated using the CKD EPI equation. This calculation has not been validated in  all clinical situations. eGFR's persistently <60 mL/min signify possible Chronic Kidney Disease.    Anion gap 13 5 - 15  I-stat troponin, ED     Status: None   Collection Time: 01/29/17  6:49 PM  Result Value Ref Range   Troponin i, poc 0.00 0.00 - 0.08 ng/mL   Comment 3            Comment: Due to the release kinetics of cTnI, a negative result within the first hours of the onset of symptoms does not rule out myocardial infarction with certainty. If myocardial infarction is still suspected, repeat the test at appropriate intervals.   Urinalysis, Routine w reflex microscopic     Status: Abnormal   Collection Time: 01/29/17  9:10 PM  Result Value Ref Range   Color, Urine YELLOW YELLOW   APPearance CLOUDY (A) CLEAR   Specific Gravity, Urine 1.021 1.005 - 1.030   pH 5.0 5.0 - 8.0   Glucose, UA NEGATIVE NEGATIVE mg/dL   Hgb urine dipstick NEGATIVE NEGATIVE   Bilirubin Urine NEGATIVE NEGATIVE   Ketones, ur NEGATIVE NEGATIVE mg/dL   Protein, ur 100 (A) NEGATIVE mg/dL   Nitrite POSITIVE (A) NEGATIVE   Leukocytes, UA MODERATE (A) NEGATIVE   RBC / HPF 0-5 0 - 5 RBC/hpf   WBC, UA TOO NUMEROUS TO COUNT 0 - 5 WBC/hpf   Bacteria, UA FEW (A) NONE SEEN   Squamous Epithelial / LPF 6-30 (A) NONE SEEN   Mucus PRESENT   Ethanol     Status: None   Collection Time: 01/29/17  9:10 PM  Result Value Ref Range   Alcohol, Ethyl (B) <5 <10 mg/dL    Comment:         LOWEST DETECTABLE LIMIT FOR SERUM ALCOHOL IS 10 mg/dL FOR MEDICAL PURPOSES ONLY Please note change in reference range.   I-stat troponin, ED     Status: None   Collection Time: 01/29/17 11:04 PM  Result Value Ref Range   Troponin i, poc 0.00 0.00 - 0.08 ng/mL   Comment 3            Comment: Due to the release kinetics of cTnI, a negative result within the first hours of the onset of symptoms does not rule out myocardial infarction with certainty. If myocardial infarction is still suspected, repeat the test at appropriate intervals.   Troponin I-serum (0, 3, 6 hours)     Status: None   Collection Time: 01/30/17 12:55 AM  Result Value Ref Range   Troponin I <0.03 <0.03 ng/mL  Vitamin B12     Status: None   Collection Time: 01/30/17 12:55 AM  Result Value Ref Range   Vitamin B-12 451 180 - 914 pg/mL    Comment: (NOTE) This assay is not validated for testing neonatal or myeloproliferative syndrome specimens for Vitamin B12 levels. Performed at [facility], [place] [address], [place], [place] [address]   TSH     Status: None   Collection Time: 01/30/17 12:55 AM  Result Value Ref Range   TSH 2.243 0.350 - 4.500 uIU/mL    Comment: Performed by a 3rd Generation assay with a functional sensitivity of <=0.01 uIU/mL.  Troponin I-serum (0, 3, 6 hours)     Status: None   Collection Time: 01/30/17  3:29 AM  Result Value Ref Range   Troponin I <0.03 <0.03 ng/mL  Glucose, capillary     Status: Abnormal   Collection Time: 01/30/17  8:15 AM  Result Value  Ref Range   Glucose-Capillary 155 (H) 65 - 99 mg/dL  Glucose, capillary     Status: Abnormal   Collection Time: 01/30/17 11:35 AM  Result Value Ref Range   Glucose-Capillary 143 (H) 65 - 99 mg/dL  Troponin I-serum (0, 3, 6 hours)     Status: None   Collection Time: 01/30/17 12:30 PM  Result Value Ref Range   Troponin I <0.03 <0.03 ng/mL  Lactic acid, plasma     Status: None   Collection Time: 01/30/17 12:30 PM  Result  Value Ref Range   Lactic Acid, Venous 1.6 0.5 - 1.9 mmol/L  D-dimer, quantitative (not at [facility])     Status: None   Collection Time: 01/30/17 12:30 PM  Result Value Ref Range   D-Dimer, Quant 0.40 0.00 - 0.50 ug/mL-FEU    Comment: (NOTE) At the manufacturer cut-off of 0.50 ug/mL FEU, this assay has been documented to exclude PE with a sensitivity and negative predictive value of 97 to 99%.  At this time, this assay has not been approved by the FDA to exclude DVT/VTE. Results should be correlated with clinical presentation.   Lipid panel     Status: None   Collection Time: 01/30/17 12:30 PM  Result Value Ref Range   Cholesterol 112 0 - 200 mg/dL   Triglycerides 92 <150 mg/dL   HDL 44 >40 mg/dL   Total CHOL/HDL Ratio 2.5 RATIO   VLDL 18 0 - 40 mg/dL   LDL Cholesterol 50 0 - 99 mg/dL    Comment:        Total Cholesterol/HDL:CHD Risk Coronary Heart Disease Risk Table                     Men   Women  1/2 Average Risk   3.4   3.3  Average Risk       5.0   4.4  2 X Average Risk   9.6   7.1  3 X Average Risk  23.4   11.0        Use the calculated Patient Ratio above and the CHD Risk Table to determine the patient's CHD Risk.        ATP III CLASSIFICATION (LDL):  <100     mg/dL   Optimal  100-129  mg/dL   Near or Above                    Optimal  130-159  mg/dL   Borderline  160-189  mg/dL   High  >190     mg/dL   Very High   CBC     Status: Abnormal   Collection Time: 01/30/17 12:30 PM  Result Value Ref Range   WBC 10.7 (H) 4.0 - 10.5 K/uL   RBC 3.90 3.87 - 5.11 MIL/uL   Hemoglobin 11.3 (L) 12.0 - 15.0 g/dL   HCT 35.0 (L) 36.0 - 46.0 %   MCV 89.7 78.0 - 100.0 fL   MCH 29.0 26.0 - 34.0 pg   MCHC 32.3 30.0 - 36.0 g/dL   RDW 13.9 11.5 - 15.5 %   Platelets 301 150 - 400 K/uL  Urine rapid drug screen (hosp performed)     Status: None   Collection Time: 01/30/17  3:20 PM  Result Value Ref Range   Opiates NONE DETECTED NONE DETECTED   Cocaine NONE DETECTED NONE DETECTED    Benzodiazepines NONE DETECTED NONE DETECTED   Amphetamines NONE DETECTED NONE DETECTED   Tetrahydrocannabinol  NONE DETECTED NONE DETECTED   Barbiturates NONE DETECTED NONE DETECTED    Comment:        DRUG SCREEN FOR MEDICAL PURPOSES ONLY.  IF CONFIRMATION IS NEEDED FOR ANY PURPOSE, NOTIFY LAB WITHIN 5 DAYS.        LOWEST DETECTABLE LIMITS FOR URINE DRUG SCREEN Drug Class       Cutoff (ng/mL) Amphetamine      1000 Barbiturate      200 Benzodiazepine   432 Tricyclics       761 Opiates          300 Cocaine          300 THC              50   Glucose, capillary     Status: Abnormal   Collection Time: 01/30/17  5:07 PM  Result Value Ref Range   Glucose-Capillary 120 (H) 65 - 99 mg/dL   Comment 1 Notify RN    Comment 2 Document in Chart     Studies/Results:   BRAIN MRI FINDINGS: Brain: No acute infarction, hemorrhage, hydrocephalus, extra-axial collection or mass lesion. Rare FLAIR hyperintensities in the cerebral white matter are nonspecific and allowable for age, likely old microvascular insults given patient's history of hypertension and diabetes. No atrophy or demyelinating pattern.  Vascular: Major vessels are patent.  Skull and upper cervical spine: Negative for marrow lesion.  Sinuses/Orbits: No acute finding. Symmetric incidental herniation of lacrimal glands.  Other: 6 mm cyst in the left parotid.  IMPRESSION: No acute finding or explanation for symptoms.        The brain MRI is reviewed in person. No acute findings are seen on DWI. No evidence of subacute white matter disease. There 1-2 areas of increased signal involving the left frontal region but otherwise unrevealing. No significant atrophy. No hemorrhage appreciated.   Zuly Belkin A. Merlene Boone, M.D.  Diplomate, Tax adviser of Psychiatry and Neurology ( Neurology). 01/30/2017, 7:05 PM

## 2017-01-30 NOTE — Evaluation (Signed)
Physical Therapy Evaluation Patient Details Name: Tiffany Boone MRN: [id number] DOB: 04-18-1956 Today's Date: 01/30/2017   History of Present Illness  Tiffany Boone is a 61yo hispanic female who comes to [facility] on 10/1 d/t Rt HA, Rt facial numbness/tingling. She also reports soem numbness/tignling pain in bilat hands and feet, RLE worse than LLE. She reports this began about 15 days ago, but got unbearable. PMH: HTN, asthma, DM, and questionable ETOH abuse. Son at bedside reports she sustains a fall to the knee about 1 year ago and has had a limp since.   Clinical Impression  Pt admitted with above diagnosis. Pt currently with functional limitations due to the deficits listed below (see "PT Problem List"). 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 The pt is awake and agreeable to participate. Pt speaking with mildly slurred speech and reports some continued paresthesia and incoordination of the tongue similar to having "[glue in here mouth]." The pt is alert and oriented x3, pleasant, conversational, and following simple  commands consistently.Pt continues to reports numbness and tingling in Right face, bilat hands, and bilat feet (R>L). Patient also gives detail on bilat knee pain (burning/stinging) that is worse with AMB and standing, that is suspected to be a chronic problem, although there is some lack of clarity reported by the interpreter who attempts multiple ways of asking this information. Son reports the patient sustained a fall onto the knee about 1 year ago and has had antalgic gait since. Functional mobility assessment demonstrates mild-strength impairment globally, the patient performing all at supervision level or better with AD. Gait impairments appeared to be more related to knee pain than acute paresthesia. Empirically, the patient demonstrates  increased risk of recurrent falls AEB gait speed <0.66m/s, forward reach <5", and inability to AMB and maintain balance without IV pole in session. Pt will benefit from skilled PT intervention to increase independence and safety with basic mobility in preparation for discharge to the venue listed below.       Follow Up Recommendations Home health PT (explained pro bono PT services in area to [facility] )    Equipment Recommendations  Rolling walker with 5" wheels    Recommendations for Other Services       Precautions / Restrictions Precautions Precautions: None Precaution Comments: no score available.  Restrictions Weight Bearing Restrictions: No      Mobility  Bed Mobility Overal bed mobility: Independent;Modified Independent                Transfers Overall transfer level: Modified independent Equipment used: None                Ambulation/Gait Ambulation/Gait assistance: Min guard Ambulation Distance (Feet): 75 Feet Assistive device:  (IV pole ) Gait Pattern/deviations:  (antalgic limping due to unilateral knee pain. )     General Gait Details: Son reports gait to appears mildly worse than baseline.   Stairs            Wheelchair Mobility    Modified Rankin (Stroke Patients Only)       Balance Overall balance assessment: Modified Independent;No apparent balance deficits (not formally assessed)  Pertinent Vitals/Pain Pain Assessment: Faces Faces Pain Scale: Hurts little more Pain Location: Bilat knees, worse with AMB (baseline problem)  Pain Descriptors / Indicators: Burning (stinging ) Pain Intervention(s): Limited activity within patient's tolerance;Monitored during session    Home Living Family/patient expects to be discharged to:: Private residence Living Arrangements: Children Available Help at Discharge: Family (family is alone with 59yo qand 3yo grandchildren 3d/week from  84-5 ) Type of Home: House Home Access: Stairs to enter Entrance Stairs-Rails:  (unclear, pt does not answer appropriately ) Entrance Stairs-Number of Steps: 5 Home Layout: One level Home Equipment: Cane - single point      Prior Function Level of Independence: Independent;Needs assistance   Gait / Transfers Assistance Needed: tolerates only household distances s AD; wil ride the motorized cart at [facility] when she goes out           Hand Dominance        Extremity/Trunk Assessment   Upper Extremity Assessment Upper Extremity Assessment:  (N/T in bila thand s)    Lower Extremity Assessment Lower Extremity Assessment:  (mild paresthesia in bilat feet and ankles; chronic knee pain )    Cervical / Trunk Assessment Cervical / Trunk Assessment:  (Rt facial N/T; tounge feels like there's "glue in my mouth' )  Communication   Communication: Prefers language other than English;Interpreter utilized;Expressive difficulties Tiffany Boone c [employer]: [phone]; patient has slurred speech today which is not baseline)  Cognition Arousal/Alertness: Awake/alert Behavior During Therapy: WFL for tasks assessed/performed Overall Cognitive Status: Within Functional Limits for tasks assessed                                 General Comments: sons attests to AMS being mostly resolved at time of PT eval      General Comments      Exercises     Assessment/Plan    PT Assessment Patient needs continued PT services  PT Problem List Decreased strength;Decreased activity tolerance;Decreased mobility;Decreased cognition;Obesity;Pain;Cardiopulmonary status limiting activity       PT Treatment Interventions DME instruction    PT Goals (Current goals can be found in the Care Plan section)  Acute Rehab PT Goals Patient Stated Goal: Resolve pain and numbness PT Goal Formulation: With patient Time For Goal Achievement: 02/13/17 Potential to Achieve Goals: Fair     Frequency Min 2X/week   Barriers to discharge Inaccessible home environment 5 stairs at entry cause pain, AMB causes SOB.     Co-evaluation               AM-PAC PT "6 Clicks" Daily Activity  Outcome Measure Difficulty turning over in bed (including adjusting bedclothes, sheets and blankets)?: A Little Difficulty moving from lying on back to sitting on the side of the bed? : A Little Difficulty sitting down on and standing up from a chair with arms (e.g., wheelchair, bedside commode, etc,.)?: A Little Help needed moving to and from a bed to chair (including a wheelchair)?: A Little Help needed walking in hospital room?: A Little Help needed climbing 3-5 steps with a railing? : A Little 6 Click Score: 18    End of Session Equipment Utilized During Treatment: Gait belt Activity Tolerance: Patient tolerated treatment well;Patient limited by fatigue;Patient limited by pain Patient left: in chair;with family/visitor present Nurse Communication: Mobility status PT Visit Diagnosis: Unsteadiness on feet (R26.81);Difficulty in walking, not elsewhere classified (R26.2)    Time: 1191-4782 PT Time  Calculation (min) (ACUTE ONLY): 35 min   Charges:   PT Evaluation $PT Eval Moderate Complexity: 1 Mod     PT G Codes:   PT G-Codes **NOT FOR INPATIENT CLASS** Functional Assessment Tool Used: AM-PAC 6 Clicks Basic Mobility;Clinical judgement Functional Limitation: Mobility: Walking and moving around Mobility: Walking and Moving Around Current Status ([id number]): At least 40 percent but less than 60 percent impaired, limited or restricted Mobility: Walking and Moving Around Goal Status [phone]): At least 40 percent but less than 60 percent impaired, limited or restricted    1:28 PM, 01/30/17 Rosamaria Lints, PT, DPT Physical Therapist -  [phone] (Office)    Buccola,Allan C 01/30/2017, 1:21 PM

## 2017-01-30 NOTE — Progress Notes (Signed)
01/29/2017  5:35 PM  01/30/2017 9:48 AM  Tiffany Boone was seen and examined.  The H&P by the admitting provider, orders, imaging was reviewed.  Please see new orders.  Will continue to follow.   Maryln Manuel, MD [facility]

## 2017-01-30 NOTE — H&P (Signed)
History and Physical    Tiffany Boone:[id number] DOB: 11/16/1955 DOA: 01/29/2017  PCP: Kizzie Furnish D., PA-C  Patient coming from:  home  Chief Complaint:   Chest pain, mouth numbness, headache, weakness, urinary frequency  HPI: Tiffany Boone is a 61 y.o. female with medical history significant of HTN, asthma, DM comes in with many complaints.  C/o right sided headache with associated right facial numbness and tingling along with chest pain which does not radiate and urinary frequency.  She reports no fevers.  No cough.  No n/v/d.  No abdominal pain.  She has etoh abuse noted in her chart but she denies this.  No vision changes.  No weakness anywhere.  No trouble speaking.  Pt found to have  Uti.  Referred for admission for her headache, numbness, chest pain and uti.   Review of Systems: As per HPI otherwise 10 point review of systems negative.   Past Medical History:  Diagnosis Date  . Asthma   . Diabetes mellitus without complication (HCC)   . Hypertension     History reviewed. No pertinent surgical history.   reports that she has never smoked. She has never used smokeless tobacco. She reports that she does not drink alcohol or use drugs.  No Known Allergies  No family history on file. no premature CAD  Prior to Admission medications   Medication Sig Start Date End Date Taking? Authorizing Provider  albuterol (PROVENTIL HFA;VENTOLIN HFA) 108 (90 Base) MCG/ACT inhaler Inhale 1-2 puffs into the lungs every 6 (six) hours as needed for wheezing or shortness of breath.   Yes [provider]  albuterol (PROVENTIL) (2.5 MG/3ML) 0.083% nebulizer solution Take 3 mLs (2.5 mg total) by nebulization every 6 (six) hours as needed for wheezing or shortness of breath. 08/09/16  Yes Lavera Guise, MD  DULoxetine (CYMBALTA) 60 MG capsule Take 120 mg by mouth daily.   Yes [provider]  gabapentin (NEURONTIN) 300 MG capsule Take 300 mg by mouth 3 (three)  times daily.   Yes [provider]  lisinopril (PRINIVIL,ZESTRIL) 20 MG tablet Take 20 mg by mouth daily.   Yes [provider]  meloxicam (MOBIC) 15 MG tablet Take 15 mg by mouth daily.   Yes [provider]  pantoprazole (PROTONIX) 40 MG tablet Take 40 mg by mouth 2 (two) times daily.   Yes [provider]  simvastatin (ZOCOR) 20 MG tablet Take 20 mg by mouth daily.   Yes [provider]    Physical Exam: Vitals:   01/29/17 2200 01/29/17 2230 01/29/17 2300 01/29/17 2313  BP: 130/75 124/85 (!) 95/58 (!) 95/58  Pulse: (!) 116 (!) 115 (!) 111 71  Resp: 19 18 (!) 22 20  Temp:      TempSrc:      SpO2: 95% 96% 92% 96%  Weight:      Height:         Constitutional: NAD, calm, comfortable Vitals:   01/29/17 2200 01/29/17 2230 01/29/17 2300 01/29/17 2313  BP: 130/75 124/85 (!) 95/58 (!) 95/58  Pulse: (!) 116 (!) 115 (!) 111 71  Resp: 19 18 (!) 22 20  Temp:      TempSrc:      SpO2: 95% 96% 92% 96%  Weight:      Height:       Eyes: PERRL, lids and conjunctivae normal ENMT: Mucous membranes are moist. Posterior pharynx clear of any exudate or lesions.Normal dentition.  Neck: normal, supple, no  masses, no thyromegaly Respiratory: clear to auscultation bilaterally, no wheezing, no crackles. Normal respiratory effort. No accessory muscle use.  Cardiovascular: Regular rate and rhythm, no murmurs / rubs / gallops. No extremity edema. 2+ pedal pulses. No carotid bruits.  Abdomen: no tenderness, no masses palpated. No hepatosplenomegaly. Bowel sounds positive.  Musculoskeletal: no clubbing / cyanosis. No joint deformity upper and lower extremities. Good ROM, no contractures. Normal muscle tone.  Skin: no rashes, lesions, ulcers. No induration Neurologic: CN 2-12 grossly intact. Sensation intact, DTR normal. Strength 5/5 in all 4.  Psychiatric: Normal judgment and insight. Alert and oriented x 3. Normal mood.    Labs on Admission: I have  personally reviewed following labs and imaging studies  CBC:  Recent Labs Lab 01/29/17 1747  WBC 14.2*  HGB 12.5  HCT 37.6  MCV 88.5  PLT 348   Basic Metabolic Panel:  Recent Labs Lab 01/29/17 1747  NA 139  K 3.8  CL 102  CO2 24  GLUCOSE 175*  BUN 12  CREATININE 0.63  CALCIUM 9.2   GFR: Estimated Creatinine Clearance: 112.3 mL/min (by C-G formula based on SCr of 0.63 mg/dL). CBG:  Recent Labs Lab 01/29/17 1734  GLUCAP 158*    Urine analysis:    Component Value Date/Time   COLORURINE YELLOW 01/29/2017 2110   APPEARANCEUR CLOUDY (A) 01/29/2017 2110   LABSPEC 1.021 01/29/2017 2110   PHURINE 5.0 01/29/2017 2110   GLUCOSEU NEGATIVE 01/29/2017 2110   HGBUR NEGATIVE 01/29/2017 2110   BILIRUBINUR NEGATIVE 01/29/2017 2110   KETONESUR NEGATIVE 01/29/2017 2110   PROTEINUR 100 (A) 01/29/2017 2110   NITRITE POSITIVE (A) 01/29/2017 2110   LEUKOCYTESUR MODERATE (A) 01/29/2017 2110   Radiological Exams on Admission: Ct Head Wo Contrast  Result Date: 01/29/2017 CLINICAL DATA:  Headache and altered mental status EXAM: CT HEAD WITHOUT CONTRAST TECHNIQUE: Contiguous axial images were obtained from the base of the skull through the vertex without intravenous contrast. COMPARISON:  CT 07/28/2007 FINDINGS: Brain: No mass lesion, intraparenchymal hemorrhage or extra-axial collection. No evidence of acute cortical infarct. Brain parenchyma and CSF-containing spaces are normal for age. Vascular: No hyperdense vessel or unexpected calcification. Skull: Normal visualized skull base, calvarium and extracranial soft tissues. Sinuses/Orbits: No sinus fluid levels or advanced mucosal thickening. No mastoid effusion. Normal orbits. IMPRESSION: Normal head CT. Electronically Signed   By: Deatra Robinson M.D.   On: 01/29/2017 18:52   Dg Chest Port 1 View  Result Date: 01/29/2017 CLINICAL DATA:  Pain EXAM: PORTABLE CHEST 1 VIEW COMPARISON:  08/09/2016 FINDINGS: The heart size and mediastinal  contours are within normal limits. Low lung volumes. Both lungs are clear. The visualized skeletal structures are unremarkable. IMPRESSION: 1. Decreased lung volumes. 2. No acute process identified. Electronically Signed   By: Signa Kell M.D.   On: 01/29/2017 19:02    EKG: Independently reviewed.  Sinus tachycardia no acute issues  Assessment/Plan 61 yo female with multiple complaints  Principal Problem:   Chest pain- serial trop.  ekg nonichemic.  Echo in am.  Active Problems:   Acute UTI- rocephin.   uc pending   Numbness and tingling of right face- obtain mri brain, ck tsh, b12 and folic acid with rpr   Alcohol use disorder- she denies this   Asthma- stable   GERD-stable   Chronic headache- give toradol to provide relief   Hypertension- stable   Diabetes mellitus without complication (HCC)- stable     DVT prophylaxis:  scds Code Status: full Family Communication:  none Disposition Plan:  Per day team Consults called:  none Admission status:  observation   Daneli Butkiewicz A MD [facility]  If 7PM-7AM, please contact night-coverage [email or web address] Password [email or web address]  01/30/2017, 12:11 AM

## 2017-01-30 NOTE — Progress Notes (Signed)
*  PRELIMINARY RESULTS* Echocardiogram 2D Echocardiogram has been performed.  Stacey Drain 01/30/2017, 10:12 AM

## 2017-01-31 ENCOUNTER — Observation Stay (HOSPITAL_COMMUNITY): Payer: PRIVATE HEALTH INSURANCE

## 2017-01-31 ENCOUNTER — Encounter (HOSPITAL_COMMUNITY): Payer: Self-pay | Admitting: Family Medicine

## 2017-01-31 DIAGNOSIS — R200 Anesthesia of skin: Secondary | ICD-10-CM

## 2017-01-31 DIAGNOSIS — E119 Type 2 diabetes mellitus without complications: Secondary | ICD-10-CM

## 2017-01-31 DIAGNOSIS — K219 Gastro-esophageal reflux disease without esophagitis: Secondary | ICD-10-CM | POA: Diagnosis not present

## 2017-01-31 DIAGNOSIS — R0789 Other chest pain: Secondary | ICD-10-CM | POA: Diagnosis not present

## 2017-01-31 DIAGNOSIS — R202 Paresthesia of skin: Principal | ICD-10-CM

## 2017-01-31 DIAGNOSIS — J45909 Unspecified asthma, uncomplicated: Secondary | ICD-10-CM | POA: Diagnosis not present

## 2017-01-31 DIAGNOSIS — I10 Essential (primary) hypertension: Secondary | ICD-10-CM

## 2017-01-31 DIAGNOSIS — N390 Urinary tract infection, site not specified: Secondary | ICD-10-CM | POA: Diagnosis not present

## 2017-01-31 LAB — COMPREHENSIVE METABOLIC PANEL
ALT: 37 U/L (ref 14–54)
ANION GAP: 7 (ref 5–15)
AST: 53 U/L — ABNORMAL HIGH (ref 15–41)
Albumin: 3.1 g/dL — ABNORMAL LOW (ref 3.5–5.0)
Alkaline Phosphatase: 76 U/L (ref 38–126)
BUN: 9 mg/dL (ref 6–20)
CALCIUM: 8.3 mg/dL — AB (ref 8.9–10.3)
CO2: 27 mmol/L (ref 22–32)
Chloride: 103 mmol/L (ref 101–111)
Creatinine, Ser: 0.54 mg/dL (ref 0.44–1.00)
GFR calc non Af Amer: >60 mL/min (ref >60)
Glucose, Bld: 147 mg/dL — ABNORMAL HIGH (ref 65–99)
Potassium: 3.6 mmol/L (ref 3.5–5.1)
SODIUM: 137 mmol/L (ref 135–145)
Total Bilirubin: 0.4 mg/dL (ref 0.3–1.2)
Total Protein: 7.1 g/dL (ref 6.5–8.1)

## 2017-01-31 LAB — FOLATE RBC
FOLATE, HEMOLYSATE: 260.5 ng/mL
Folate, RBC: 757 ng/mL (ref >498)
HEMATOCRIT: 34.4 % (ref 34.0–46.6)

## 2017-01-31 LAB — CBC WITH DIFFERENTIAL/PLATELET
BASOS ABS: 0 10*3/uL (ref 0.0–0.1)
Basophils Relative: 0 %
EOS ABS: 0.3 10*3/uL (ref 0.0–0.7)
EOS PCT: 3 %
HCT: 35.6 % — ABNORMAL LOW (ref 36.0–46.0)
HEMOGLOBIN: 11 g/dL — AB (ref 12.0–15.0)
Lymphocytes Relative: 33 %
Lymphs Abs: 2.8 10*3/uL (ref 0.7–4.0)
MCH: 28.2 pg (ref 26.0–34.0)
MCHC: 30.9 g/dL (ref 30.0–36.0)
MCV: 91.3 fL (ref 78.0–100.0)
Monocytes Absolute: 0.6 10*3/uL (ref 0.1–1.0)
Monocytes Relative: 7 %
NEUTROS PCT: 57 %
Neutro Abs: 4.7 10*3/uL (ref 1.7–7.7)
PLATELETS: 288 10*3/uL (ref 150–400)
RBC: 3.9 MIL/uL (ref 3.87–5.11)
RDW: 14 % (ref 11.5–15.5)
WBC: 8.4 10*3/uL (ref 4.0–10.5)

## 2017-01-31 LAB — GLUCOSE, CAPILLARY
GLUCOSE-CAPILLARY: 166 mg/dL — AB (ref 65–99)
Glucose-Capillary: 135 mg/dL — ABNORMAL HIGH (ref 65–99)

## 2017-01-31 LAB — URINE CULTURE

## 2017-01-31 LAB — RPR: RPR Ser Ql: NONREACTIVE

## 2017-01-31 MED ORDER — IOPAMIDOL (ISOVUE-370) INJECTION 76%
75.0000 mL | Freq: Once | INTRAVENOUS | Status: AC | PRN
  Administered 2017-01-31: 75 mL via INTRAVENOUS

## 2017-01-31 MED ORDER — CEPHALEXIN 500 MG PO TABS: 500.0000 mg | ORAL_TABLET | Freq: Three times a day (TID) | ORAL | 0 refills | Status: AC

## 2017-01-31 MED ORDER — ASPIRIN 325 MG PO TBEC: 325.0000 mg | DELAYED_RELEASE_TABLET | Freq: Every day | ORAL | 0 refills | Status: DC

## 2017-01-31 NOTE — Discharge Instructions (Signed)
Follow with Primary MD  Muse, Verdell Face., PA-C  and other consultant's as instructed your Hospitalist MD  Please get a complete blood count and chemistry panel checked by your Primary MD at your next visit, and again as instructed by your Primary MD.  Get Medicines reviewed and adjusted: Please take all your medications with you for your next visit with your Primary MD  Laboratory/radiological data: Please request your Primary MD to go over all hospital tests and procedure/radiological results at the follow up, please ask your Primary MD to get all Hospital records sent to his/her office.  In some cases, they will be blood work, cultures and biopsy results pending at the time of your discharge. Please request that your primary care M.D. follows up on these results.  Also Note the following: If you experience worsening of your admission symptoms, develop shortness of breath, life threatening emergency, suicidal or homicidal thoughts you must seek medical attention immediately by calling 911 or calling your MD immediately  if symptoms less severe.  You must read complete instructions/literature along with all the possible adverse reactions/side effects for all the Medicines you take and that have been prescribed to you. Take any new Medicines after you have completely understood and accpet all the possible adverse reactions/side effects.   Do not drive when taking Pain medications or sleeping medications (Benzodaizepines)  Do not take more than prescribed Pain, Sleep and Anxiety Medications. It is not advisable to combine anxiety,sleep and pain medications without talking with your primary care practitioner  Special Instructions: If you have smoked or chewed Tobacco  in the last 2 yrs please stop smoking, stop any regular Alcohol  and or any Recreational drug use.  Wear Seat belts while driving.  Please note: You were cared for by a hospitalist during your hospital stay. Once you are  discharged, your primary care physician will handle any further medical issues. Please note that NO REFILLS for any discharge medications will be authorized once you are discharged, as it is imperative that you return to your primary care physician (or establish a relationship with a primary care physician if you do not have one) for your post hospital discharge needs so that they can reassess your need for medications and monitor your lab values.

## 2017-01-31 NOTE — Progress Notes (Signed)
Discharge instructions and prescription given to patient's son Reuel Boom who verbalized understanding, out in stable condition via w/c with son.

## 2017-01-31 NOTE — Discharge Summary (Signed)
Physician Discharge Summary  Tiffany Boone ZOX:[id number] DOB: 11-05-1955 DOA: 01/29/2017  PCP: Kizzie Furnish D., PA-C  Admit date: 01/29/2017 Discharge date: 01/31/2017  Admitted From: Home  Disposition: Home   Recommendations for Outpatient Follow-up:  1. Follow up with PCP in 1 weeks 2. Please obtain BMP/CBC in one week 3. Please follow up on the following pending results:  Discharge Condition: STABLE   CODE STATUS: FULL    Brief Hospitalization Summary: Please see all hospital notes, images, labs for full details of the hospitalization. From HPI: Tiffany Boone is a 61 y.o. female with medical history significant of HTN, asthma, DM comes in with many complaints.  C/o right sided headache with associated right facial numbness and tingling along with chest pain which does not radiate and urinary frequency.  She reports no fevers.  No cough.  No n/v/d.  No abdominal pain.  She has etoh abuse noted in her chart but she denies this.  No vision changes.  No weakness anywhere.  No trouble speaking.  Pt found to have  Uti.  Referred for admission for her headache, numbness, chest pain and uti. The patient is a 61 year old Latino American female who presents with acute onset of severe headaches involving the frontal occipital region.  These were associated with the numbness and tingling of the right side which is shifted to the left side.  Symptoms happened acutely and was also associated with periorbital numbness.  Interestingly, she reportedly has had a 3 day history of numbness and tingling of the hands bilaterally.    Patient does not report having focal weakness.  No problems with dysarthria or dysphagia.  No dizziness reported. This was not speak for living was and this limits the evaluation but the translation is obtained by her children.  Her son previously worked as a Copy in this hospital.  It appears that the patient may have had some associated chest pain and with  her symptoms.  She has a history of obstructive sleep apnea syndrome and is compliant with her CPAP machine per the family.   The review of systems otherwise negative.    MDM/Assessment & Plan:   1. UTI - pt reports symptoms have improved, but urine culture was indeterminate. Cephalexin x 2 more days.   2. Facial paresthesias - MRI negative for findings to explain symptoms.  Neurology consulted and ordered a CTA to rule out vertebrobasilar disease.  CTA was negative. Also neurology recommended outpatient nerve conduction studies.  Will ask patient to follow up with neurology outpatient. She has been started on aspirin 325 mg daily for antiplatelet activity. 3. OSA - resume home CPAP.   4. Type 2 diabetes mellitus- reasonably well controlled as evidenced by A1c of 6.9%.   5. GERD - stable on protonix.  6. Asthma - stable.   DVT prophylaxis: SCDs Code Status: full  Family Communication: son bedside Disposition Plan: Home tomorrow   Consultants:  neurology Discharge Diagnoses:  Principal Problem:   Chest pain Active Problems:   Alcohol use disorder   Asthma   GERD   Chronic headache   Hypertension   Diabetes mellitus without complication (HCC)   Numbness and tingling of right face   Acute UTI    Discharge Instructions: Discharge Instructions    Call MD for:  difficulty breathing, headache or visual disturbances    Complete by:  As directed    Call MD for:  extreme fatigue    Complete by:  As directed  Call MD for:  hives    Complete by:  As directed    Call MD for:  persistant dizziness or light-headedness    Complete by:  As directed    Call MD for:  persistant nausea and vomiting    Complete by:  As directed    Call MD for:  severe uncontrolled pain    Complete by:  As directed    Call MD for:  temperature >100.4    Complete by:  As directed    Increase activity slowly    Complete by:  As directed      Allergies as of 01/31/2017   No Known Allergies      Medication List    STOP taking these medications   meloxicam 15 MG tablet Commonly known as:  MOBIC     TAKE these medications   albuterol 108 (90 Base) MCG/ACT inhaler Commonly known as:  PROVENTIL HFA;VENTOLIN HFA Inhale 1-2 puffs into the lungs every 6 (six) hours as needed for wheezing or shortness of breath.   albuterol (2.5 MG/3ML) 0.083% nebulizer solution Commonly known as:  PROVENTIL Take 3 mLs (2.5 mg total) by nebulization every 6 (six) hours as needed for wheezing or shortness of breath.   aspirin 325 MG EC tablet Take 1 tablet (325 mg total) by mouth daily.   Cephalexin 500 MG tablet Take 1 tablet (500 mg total) by mouth 3 (three) times daily.   DULoxetine 60 MG capsule Commonly known as:  CYMBALTA Take 120 mg by mouth daily.   gabapentin 300 MG capsule Commonly known as:  NEURONTIN Take 300 mg by mouth 3 (three) times daily.   lisinopril 20 MG tablet Commonly known as:  PRINIVIL,ZESTRIL Take 20 mg by mouth daily.   pantoprazole 40 MG tablet Commonly known as:  PROTONIX Take 40 mg by mouth 2 (two) times daily.   simvastatin 20 MG tablet Commonly known as:  ZOCOR Take 20 mg by mouth daily.            Durable Medical Equipment        Start     Ordered   01/30/17 1243  For home use only DME Walker rolling  Once    Question:  Patient needs a walker to treat with the following condition  Answer:  General weakness   01/30/17 1243     Follow-up Information    Muse, Verdell Face., PA-C. Schedule an appointment as soon as possible for a visit in 1 week(s).   Why:  Hospital Follow Up  Contact information: [address] [place] [address] [phone]        Beryle Beams, MD. Schedule an appointment as soon as possible for a visit in 1 month(s).   Specialty:  Neurology Why:  Hospital Follow Up  Contact information: [address] [place] [address] [phone]          No Known Allergies Current Discharge  Medication List    START taking these medications   Details  aspirin EC 325 MG EC tablet Take 1 tablet (325 mg total) by mouth daily. Qty: 30 tablet, Refills: 0    Cephalexin 500 MG tablet Take 1 tablet (500 mg total) by mouth 3 (three) times daily. Qty: 6 tablet, Refills: 0      CONTINUE these medications which have NOT CHANGED   Details  albuterol (PROVENTIL HFA;VENTOLIN HFA) 108 (90 Base) MCG/ACT inhaler Inhale 1-2 puffs into the lungs every 6 (six) hours as  needed for wheezing or shortness of breath.    albuterol (PROVENTIL) (2.5 MG/3ML) 0.083% nebulizer solution Take 3 mLs (2.5 mg total) by nebulization every 6 (six) hours as needed for wheezing or shortness of breath. Qty: 75 mL, Refills: 12    DULoxetine (CYMBALTA) 60 MG capsule Take 120 mg by mouth daily.    gabapentin (NEURONTIN) 300 MG capsule Take 300 mg by mouth 3 (three) times daily.    lisinopril (PRINIVIL,ZESTRIL) 20 MG tablet Take 20 mg by mouth daily.    pantoprazole (PROTONIX) 40 MG tablet Take 40 mg by mouth 2 (two) times daily.    simvastatin (ZOCOR) 20 MG tablet Take 20 mg by mouth daily.      STOP taking these medications     meloxicam (MOBIC) 15 MG tablet         Procedures/Studies: Ct Angio Head W Or Wo Contrast  Result Date: 01/31/2017 CLINICAL DATA:  61 year old female with acute onset severe frontal headache, occipital region numbness and tingling. EXAM: CT ANGIOGRAPHY HEAD AND NECK TECHNIQUE: Multidetector CT imaging of the head and neck was performed using the standard protocol during bolus administration of intravenous contrast. Multiplanar CT image reconstructions and MIPs were obtained to evaluate the vascular anatomy. Carotid stenosis measurements (when applicable) are obtained utilizing NASCET criteria, using the distal internal carotid diameter as the denominator. CONTRAST:  75 mL Isovue 370 COMPARISON:  Brain MRI 01/30/2017.  Noncontrast head CT 01/29/2017. FINDINGS: CT HEAD Brain: No midline  shift, ventriculomegaly, mass effect, evidence of mass lesion, intracranial hemorrhage or evidence of cortically based acute infarction. Gray-white matter differentiation is within normal limits throughout the brain. Calvarium and skull base: No acute osseous abnormality identified. Paranasal sinuses: Visualized paranasal sinuses and mastoids are stable and well pneumatized. Orbits: Visualized orbits and scalp soft tissues are within normal limits. CTA NECK Skeleton: Straightening of cervical lordosis and degenerative endplate spurring in the cervical spine. Upper chest: Small calcified granuloma in the periphery of the left upper lobe. Superior mediastinal lipomatosis, no superior mediastinal lymphadenopathy. Other neck: Negative; retropharyngeal course of both carotid arteries. No cervical lymphadenopathy. Aortic arch: 3 vessel arch configuration with no atherosclerosis. Right carotid system: Tortuous right CCA. Retropharyngeal course of the right CCA, the right carotid bifurcation, and the proximal right ICA. No atherosclerosis or stenosis. Left carotid system: Negative aside from tortuosity and partial retropharyngeal course. Vertebral arteries: No proximal right subclavian artery plaque or stenosis. Normal right vertebral artery origin. Negative right vertebral artery to the skullbase. No proximal left subclavian artery plaque or stenosis. Normal left vertebral artery origin. Tortuous left V1 segment. Negative left vertebral artery to the skullbase. CTA HEAD Posterior circulation: Codominant distal vertebral arteries are normal to the vertebrobasilar junction. Patent PICA origins. Patent basilar artery without stenosis. Normal SCA and PCA origins. Posterior communicating arteries are diminutive or absent. Bilateral PCA branches are within normal limits. Anterior circulation: Patent ICA siphons with no plaque or stenosis. Normal ophthalmic artery origins. Normal carotid termini, MCA and ACA origins. Mildly  tortuous bilateral ACA is. Diminutive or absent anterior communicating artery. Bilateral ACA branches are within normal limits. Left MCA M1 segment, bifurcation, and left MCA branches are within normal limits. Right MCA M1 segment, bifurcation, and right MCA branches are within normal limits. Venous sinuses: Patent. Anatomic variants: None. Delayed phase: No abnormal enhancement identified. Review of the MIP images confirms the above findings IMPRESSION: 1. Negative CTA Head and Neck aside from arterial tortuosity in the neck. Retropharyngeal course of both carotid arteries. 2.  Stable and normal CT appearance of the brain. Electronically Signed   By: Odessa Fleming M.D.   On: 01/31/2017 12:28   Ct Head Wo Contrast  Result Date: 01/29/2017 CLINICAL DATA:  Headache and altered mental status EXAM: CT HEAD WITHOUT CONTRAST TECHNIQUE: Contiguous axial images were obtained from the base of the skull through the vertex without intravenous contrast. COMPARISON:  CT 07/28/2007 FINDINGS: Brain: No mass lesion, intraparenchymal hemorrhage or extra-axial collection. No evidence of acute cortical infarct. Brain parenchyma and CSF-containing spaces are normal for age. Vascular: No hyperdense vessel or unexpected calcification. Skull: Normal visualized skull base, calvarium and extracranial soft tissues. Sinuses/Orbits: No sinus fluid levels or advanced mucosal thickening. No mastoid effusion. Normal orbits. IMPRESSION: Normal head CT. Electronically Signed   By: Deatra Robinson M.D.   On: 01/29/2017 18:52   Ct Angio Neck W Or Wo Contrast  Result Date: 01/31/2017 CLINICAL DATA:  61 year old female with acute onset severe frontal headache, occipital region numbness and tingling. EXAM: CT ANGIOGRAPHY HEAD AND NECK TECHNIQUE: Multidetector CT imaging of the head and neck was performed using the standard protocol during bolus administration of intravenous contrast. Multiplanar CT image reconstructions and MIPs were obtained to evaluate  the vascular anatomy. Carotid stenosis measurements (when applicable) are obtained utilizing NASCET criteria, using the distal internal carotid diameter as the denominator. CONTRAST:  75 mL Isovue 370 COMPARISON:  Brain MRI 01/30/2017.  Noncontrast head CT 01/29/2017. FINDINGS: CT HEAD Brain: No midline shift, ventriculomegaly, mass effect, evidence of mass lesion, intracranial hemorrhage or evidence of cortically based acute infarction. Gray-white matter differentiation is within normal limits throughout the brain. Calvarium and skull base: No acute osseous abnormality identified. Paranasal sinuses: Visualized paranasal sinuses and mastoids are stable and well pneumatized. Orbits: Visualized orbits and scalp soft tissues are within normal limits. CTA NECK Skeleton: Straightening of cervical lordosis and degenerative endplate spurring in the cervical spine. Upper chest: Small calcified granuloma in the periphery of the left upper lobe. Superior mediastinal lipomatosis, no superior mediastinal lymphadenopathy. Other neck: Negative; retropharyngeal course of both carotid arteries. No cervical lymphadenopathy. Aortic arch: 3 vessel arch configuration with no atherosclerosis. Right carotid system: Tortuous right CCA. Retropharyngeal course of the right CCA, the right carotid bifurcation, and the proximal right ICA. No atherosclerosis or stenosis. Left carotid system: Negative aside from tortuosity and partial retropharyngeal course. Vertebral arteries: No proximal right subclavian artery plaque or stenosis. Normal right vertebral artery origin. Negative right vertebral artery to the skullbase. No proximal left subclavian artery plaque or stenosis. Normal left vertebral artery origin. Tortuous left V1 segment. Negative left vertebral artery to the skullbase. CTA HEAD Posterior circulation: Codominant distal vertebral arteries are normal to the vertebrobasilar junction. Patent PICA origins. Patent basilar artery without  stenosis. Normal SCA and PCA origins. Posterior communicating arteries are diminutive or absent. Bilateral PCA branches are within normal limits. Anterior circulation: Patent ICA siphons with no plaque or stenosis. Normal ophthalmic artery origins. Normal carotid termini, MCA and ACA origins. Mildly tortuous bilateral ACA is. Diminutive or absent anterior communicating artery. Bilateral ACA branches are within normal limits. Left MCA M1 segment, bifurcation, and left MCA branches are within normal limits. Right MCA M1 segment, bifurcation, and right MCA branches are within normal limits. Venous sinuses: Patent. Anatomic variants: None. Delayed phase: No abnormal enhancement identified. Review of the MIP images confirms the above findings IMPRESSION: 1. Negative CTA Head and Neck aside from arterial tortuosity in the neck. Retropharyngeal course of both carotid arteries. 2. Stable and  normal CT appearance of the brain. Electronically Signed   By: Odessa Fleming M.D.   On: 01/31/2017 12:28   Mr Brain Wo Contrast  Result Date: 01/30/2017 CLINICAL DATA:  Altered mental status for 2 days. Numbness on the right side. EXAM: MRI HEAD WITHOUT CONTRAST TECHNIQUE: Multiplanar, multiecho pulse sequences of the brain and surrounding structures were obtained without intravenous contrast. COMPARISON:  Head CT from yesterday FINDINGS: Brain: No acute infarction, hemorrhage, hydrocephalus, extra-axial collection or mass lesion. Rare FLAIR hyperintensities in the cerebral white matter are nonspecific and allowable for age, likely old microvascular insults given patient's history of hypertension and diabetes. No atrophy or demyelinating pattern. Vascular: Major vessels are patent. Skull and upper cervical spine: Negative for marrow lesion. Sinuses/Orbits: No acute finding. Symmetric incidental herniation of lacrimal glands. Other: 6 mm cyst in the left parotid. IMPRESSION: No acute finding or explanation for symptoms. Electronically  Signed   By: Marnee Spring M.D.   On: 01/30/2017 08:36   Dg Chest Port 1 View  Result Date: 01/29/2017 CLINICAL DATA:  Pain EXAM: PORTABLE CHEST 1 VIEW COMPARISON:  08/09/2016 FINDINGS: The heart size and mediastinal contours are within normal limits. Low lung volumes. Both lungs are clear. The visualized skeletal structures are unremarkable. IMPRESSION: 1. Decreased lung volumes. 2. No acute process identified. Electronically Signed   By: Signa Kell M.D.   On: 01/29/2017 19:02      Subjective: Pt says she feels a little better today.  The facial symptoms are much better  Discharge Exam: Vitals:   01/31/17 0200 01/31/17 0600  BP: 136/79 (!) 145/81  Pulse: 87 82  Resp: 20 20  Temp: 98 F (36.7 C) 98.6 F (37 C)  SpO2: 96% 99%   Vitals:   01/30/17 1931 01/30/17 2200 01/31/17 0200 01/31/17 0600  BP: 124/69 137/90 136/79 (!) 145/81  Pulse: 92 87 87 82  Resp: Temp: 98 F (36.7 C) 97.6 F (36.4 C) 98 F (36.7 C) 98.6 F (37 C)  TempSrc: Oral Oral Oral Oral  SpO2: 98% 97% 96% 99%  Weight:      Height:       General exam: awake, alert, NAD. Cooperative, non English speaking. Obese.  Respiratory system: Clear. No increased work of breathing. Cardiovascular system: S1 & S2 heard, RRR. No JVD, murmurs, gallops, clicks or pedal edema. Gastrointestinal system: Abdomen is nondistended, soft and nontender. Normal bowel sounds heard. Central nervous system: Alert and oriented. No focal neurological deficits. Extremities: no CCE.   The results of significant diagnostics from this hospitalization (including imaging, microbiology, ancillary and laboratory) are listed below for reference.     Microbiology: Recent Results (from the past 240 hour(s))  Urine Culture     Status: Abnormal   Collection Time: 01/29/17  9:11 PM  Result Value Ref Range Status   Specimen Description URINE, CLEAN CATCH  Final   Special Requests NONE  Final   Culture MULTIPLE SPECIES PRESENT,  SUGGEST RECOLLECTION (A)  Final   Report Status 01/31/2017 FINAL  Final  Culture, blood (Routine X 2) w Reflex to ID Panel     Status: None (Preliminary result)   Collection Time: 01/30/17 12:30 PM  Result Value Ref Range Status   Specimen Description BLOOD  Final   Special Requests NONE  Final   Culture NO GROWTH < 24 HOURS  Final   Report Status PENDING  Incomplete  Culture, blood (Routine X 2) w Reflex to ID Panel  Status: None (Preliminary result)   Collection Time: 01/30/17 12:31 PM  Result Value Ref Range Status   Specimen Description BLOOD LEFT FOREARM  Final   Special Requests   Final    BOTTLES DRAWN AEROBIC AND ANAEROBIC Blood Culture adequate volume   Culture NO GROWTH < 24 HOURS  Final   Report Status PENDING  Incomplete     Labs: BNP (last 3 results)  Recent Labs  08/09/16 1750  BNP 14.0   Basic Metabolic Panel:  Recent Labs Lab 01/29/17 1747 01/31/17 0540  NA 139 137  K 3.8 3.6  CL 102 103  CO2 24 27  GLUCOSE 175* 147*  BUN 12 9  CREATININE 0.63 0.54  CALCIUM 9.2 8.3*   Liver Function Tests:  Recent Labs Lab 01/31/17 0540  AST 53*  ALT 37  ALKPHOS 76  BILITOT 0.4  PROT 7.1  ALBUMIN 3.1*   No results for input(s): LIPASE, AMYLASE in the last 168 hours. No results for input(s): AMMONIA in the last 168 hours. CBC:  Recent Labs Lab 01/29/17 1747 01/30/17 0055 01/30/17 1230 01/31/17 0540  WBC 14.2*  --  10.7* 8.4  NEUTROABS  --   --   --  4.7  HGB 12.5  --  11.3* 11.0*  HCT 37.6 34.4 35.0* 35.6*  MCV 88.5  --  89.7 91.3  PLT 348  --  301 288   Cardiac Enzymes:  Recent Labs Lab 01/30/17 0055 01/30/17 0329 01/30/17 1230  TROPONINI <0.03 <0.03 <0.03   BNP: Invalid input(s): POCBNP CBG:  Recent Labs Lab 01/30/17 1135 01/30/17 1707 01/30/17 2013 01/31/17 0803 01/31/17 1210  GLUCAP 143* 120* 130* 135* 166*   D-Dimer  Recent Labs  01/30/17 1230  DDIMER 0.40   Hgb A1c  Recent Labs  01/30/17 1230  HGBA1C 6.9*    Lipid Profile  Recent Labs  01/30/17 1230  CHOL 112  HDL 44  LDLCALC 50  TRIG 92  CHOLHDL 2.5   Thyroid function studies  Recent Labs  01/30/17 0055  TSH 2.243   Anemia work up  Recent Labs  01/30/17 0055  VITAMINB12 451   Urinalysis    Component Value Date/Time   COLORURINE YELLOW 01/29/2017 2110   APPEARANCEUR CLOUDY (A) 01/29/2017 2110   LABSPEC 1.021 01/29/2017 2110   PHURINE 5.0 01/29/2017 2110   GLUCOSEU NEGATIVE 01/29/2017 2110   HGBUR NEGATIVE 01/29/2017 2110   BILIRUBINUR NEGATIVE 01/29/2017 2110   KETONESUR NEGATIVE 01/29/2017 2110   PROTEINUR 100 (A) 01/29/2017 2110   NITRITE POSITIVE (A) 01/29/2017 2110   LEUKOCYTESUR MODERATE (A) 01/29/2017 2110   Sepsis Labs Invalid input(s): PROCALCITONIN,  WBC,  LACTICIDVEN Microbiology Recent Results (from the past 240 hour(s))  Urine Culture     Status: Abnormal   Collection Time: 01/29/17  9:11 PM  Result Value Ref Range Status   Specimen Description URINE, CLEAN CATCH  Final   Special Requests NONE  Final   Culture MULTIPLE SPECIES PRESENT, SUGGEST RECOLLECTION (A)  Final   Report Status 01/31/2017 FINAL  Final  Culture, blood (Routine X 2) w Reflex to ID Panel     Status: None (Preliminary result)   Collection Time: 01/30/17 12:30 PM  Result Value Ref Range Status   Specimen Description BLOOD  Final   Special Requests NONE  Final   Culture NO GROWTH < 24 HOURS  Final   Report Status PENDING  Incomplete  Culture, blood (Routine X 2) w Reflex to ID Panel  Status: None (Preliminary result)   Collection Time: 01/30/17 12:31 PM  Result Value Ref Range Status   Specimen Description BLOOD LEFT FOREARM  Final   Special Requests   Final    BOTTLES DRAWN AEROBIC AND ANAEROBIC Blood Culture adequate volume   Culture NO GROWTH < 24 HOURS  Final   Report Status PENDING  Incomplete    Time coordinating discharge:   SIGNED:  Standley Dakins, MD  [facility] 01/31/2017, 2:08 PM Pager [phone]  If 7PM-7AM, please contact night-coverage [email or web address] Password [email or web address]

## 2017-01-31 NOTE — Progress Notes (Signed)
OT Cancellation Note  Patient Details Name: Tiffany Boone MRN: [id number] DOB: 16-Dec-1955   Cancelled Treatment:    Reason Eval/Treat Not Completed: OT screened, no needs identified, will sign off. Chart reviewed, pt screened for OT needs. Pt is at baseline with functional task completion, no further OT needs at this time.   Ezra Sites, OTR/L  [phone] 01/31/2017, 9:15 AM

## 2017-01-31 NOTE — Progress Notes (Signed)
PROGRESS NOTE    Tiffany Boone  YQM:[id number]  DOB: May 21, 1955  DOA: 01/29/2017 PCP: Tylene Fantasia., PA-C   Brief Admission Hx: Tiffany Boone is a 61 y.o. female with medical history significant of HTN, asthma, DM comes in with many complaints.  C/o right sided headache with associated right facial numbness and tingling along with chest pain which does not radiate and urinary frequency.  MDM/Assessment & Plan:   1. UTI - pt reports symptoms have improved, but urine culture was indeterminate, would treat with 3 doses of ceftriaxone and discontinue.  Last dose should be given today.  2. Facial paresthesias - MRI negative for findings to explain symptoms.  Neurology consulted and ordered a CTA to rule out vertebrobasilar disease.  Follow up on that study and final neurology recommendations.  Also neurology recommended outpatient nerve conduction studies.  She has been started on aspirin 325 mg daily for antiplatelet activity. 3. OSA - Pt needs outpatient sleep study arranged.  4. Type 2 diabetes mellitus- reasonably well controlled as evidenced by A1c of 6.9%.   5. GERD - stable on protonix.  6. Asthma - stable.    DVT prophylaxis: SCDs Code Status: full  Family Communication: son bedside Disposition Plan: Home tomorrow   Consultants:  neurology   Subjective: Pt says she feels a little better today.  The facial symptoms are much better.    Objective: Vitals:   01/30/17 1931 01/30/17 2200 01/31/17 0200 01/31/17 0600  BP: 124/69 137/90 136/79 (!) 145/81  Pulse: 92 87 87 82  Resp: Temp: 98 F (36.7 C) 97.6 F (36.4 C) 98 F (36.7 C) 98.6 F (37 C)  TempSrc: Oral Oral Oral Oral  SpO2: 98% 97% 96% 99%  Weight:      Height:        Intake/Output Summary (Last 24 hours) at 01/31/17 1322 Last data filed at 01/31/17 5284  Gross per 24 hour  Intake          2161.67 ml  Output              900 ml  Net          1261.67 ml   Filed  Weights   01/29/17 1732 01/30/17 0020  Weight: 136.1 kg (300 lb) 120.5 kg (265 lb 10.5 oz)     REVIEW OF SYSTEMS  As per history otherwise all reviewed and reported negative  Exam:  General exam: awake, alert, NAD. Cooperative, non English speaking. Obese.  Respiratory system: Clear. No increased work of breathing. Cardiovascular system: S1 & S2 heard, RRR. No JVD, murmurs, gallops, clicks or pedal edema. Gastrointestinal system: Abdomen is nondistended, soft and nontender. Normal bowel sounds heard. Central nervous system: Alert and oriented. No focal neurological deficits. Extremities: no CCE.   Data Reviewed: Basic Metabolic Panel:  Recent Labs Lab 01/29/17 1747 01/31/17 0540  NA 139 137  K 3.8 3.6  CL 102 103  CO2 24 27  GLUCOSE 175* 147*  BUN 12 9  CREATININE 0.63 0.54  CALCIUM 9.2 8.3*   Liver Function Tests:  Recent Labs Lab 01/31/17 0540  AST 53*  ALT 37  ALKPHOS 76  BILITOT 0.4  PROT 7.1  ALBUMIN 3.1*   No results for input(s): LIPASE, AMYLASE in the last 168 hours. No results for input(s): AMMONIA in the last 168 hours. CBC:  Recent Labs Lab 01/29/17 1747 01/30/17 1230 01/31/17 0540  WBC 14.2* 10.7* 8.4  NEUTROABS  --   --  4.7  HGB 12.5 11.3* 11.0*  HCT 37.6 35.0* 35.6*  MCV 88.5 89.7 91.3  PLT 348 301 288   Cardiac Enzymes:  Recent Labs Lab 01/30/17 0055 01/30/17 0329 01/30/17 1230  TROPONINI <0.03 <0.03 <0.03   CBG (last 3)   Recent Labs  01/30/17 2013 01/31/17 0803 01/31/17 1210  GLUCAP 130* 135* 166*   Recent Results (from the past 240 hour(s))  Urine Culture     Status: Abnormal   Collection Time: 01/29/17  9:11 PM  Result Value Ref Range Status   Specimen Description URINE, CLEAN CATCH  Final   Special Requests NONE  Final   Culture MULTIPLE SPECIES PRESENT, SUGGEST RECOLLECTION (A)  Final   Report Status 01/31/2017 FINAL  Final  Culture, blood (Routine X 2) w Reflex to ID Panel     Status: None (Preliminary  result)   Collection Time: 01/30/17 12:30 PM  Result Value Ref Range Status   Specimen Description BLOOD  Final   Special Requests NONE  Final   Culture NO GROWTH < 24 HOURS  Final   Report Status PENDING  Incomplete  Culture, blood (Routine X 2) w Reflex to ID Panel     Status: None (Preliminary result)   Collection Time: 01/30/17 12:31 PM  Result Value Ref Range Status   Specimen Description BLOOD LEFT FOREARM  Final   Special Requests   Final    BOTTLES DRAWN AEROBIC AND ANAEROBIC Blood Culture adequate volume   Culture NO GROWTH < 24 HOURS  Final   Report Status PENDING  Incomplete     Studies: Ct Angio Head W Or Wo Contrast  Result Date: 01/31/2017 CLINICAL DATA:  62 year old female with acute onset severe frontal headache, occipital region numbness and tingling. EXAM: CT ANGIOGRAPHY HEAD AND NECK TECHNIQUE: Multidetector CT imaging of the head and neck was performed using the standard protocol during bolus administration of intravenous contrast. Multiplanar CT image reconstructions and MIPs were obtained to evaluate the vascular anatomy. Carotid stenosis measurements (when applicable) are obtained utilizing NASCET criteria, using the distal internal carotid diameter as the denominator. CONTRAST:  75 mL Isovue 370 COMPARISON:  Brain MRI 01/30/2017.  Noncontrast head CT 01/29/2017. FINDINGS: CT HEAD Brain: No midline shift, ventriculomegaly, mass effect, evidence of mass lesion, intracranial hemorrhage or evidence of cortically based acute infarction. Gray-white matter differentiation is within normal limits throughout the brain. Calvarium and skull base: No acute osseous abnormality identified. Paranasal sinuses: Visualized paranasal sinuses and mastoids are stable and well pneumatized. Orbits: Visualized orbits and scalp soft tissues are within normal limits. CTA NECK Skeleton: Straightening of cervical lordosis and degenerative endplate spurring in the cervical spine. Upper chest: Small  calcified granuloma in the periphery of the left upper lobe. Superior mediastinal lipomatosis, no superior mediastinal lymphadenopathy. Other neck: Negative; retropharyngeal course of both carotid arteries. No cervical lymphadenopathy. Aortic arch: 3 vessel arch configuration with no atherosclerosis. Right carotid system: Tortuous right CCA. Retropharyngeal course of the right CCA, the right carotid bifurcation, and the proximal right ICA. No atherosclerosis or stenosis. Left carotid system: Negative aside from tortuosity and partial retropharyngeal course. Vertebral arteries: No proximal right subclavian artery plaque or stenosis. Normal right vertebral artery origin. Negative right vertebral artery to the skullbase. No proximal left subclavian artery plaque or stenosis. Normal left vertebral artery origin. Tortuous left V1 segment. Negative left vertebral artery to the skullbase. CTA HEAD Posterior circulation: Codominant distal vertebral arteries are normal to the vertebrobasilar junction. Patent PICA origins. Patent basilar artery  without stenosis. Normal SCA and PCA origins. Posterior communicating arteries are diminutive or absent. Bilateral PCA branches are within normal limits. Anterior circulation: Patent ICA siphons with no plaque or stenosis. Normal ophthalmic artery origins. Normal carotid termini, MCA and ACA origins. Mildly tortuous bilateral ACA is. Diminutive or absent anterior communicating artery. Bilateral ACA branches are within normal limits. Left MCA M1 segment, bifurcation, and left MCA branches are within normal limits. Right MCA M1 segment, bifurcation, and right MCA branches are within normal limits. Venous sinuses: Patent. Anatomic variants: None. Delayed phase: No abnormal enhancement identified. Review of the MIP images confirms the above findings IMPRESSION: 1. Negative CTA Head and Neck aside from arterial tortuosity in the neck. Retropharyngeal course of both carotid arteries. 2.  Stable and normal CT appearance of the brain. Electronically Signed   By: Odessa Fleming M.D.   On: 01/31/2017 12:28   Ct Head Wo Contrast  Result Date: 01/29/2017 CLINICAL DATA:  Headache and altered mental status EXAM: CT HEAD WITHOUT CONTRAST TECHNIQUE: Contiguous axial images were obtained from the base of the skull through the vertex without intravenous contrast. COMPARISON:  CT 07/28/2007 FINDINGS: Brain: No mass lesion, intraparenchymal hemorrhage or extra-axial collection. No evidence of acute cortical infarct. Brain parenchyma and CSF-containing spaces are normal for age. Vascular: No hyperdense vessel or unexpected calcification. Skull: Normal visualized skull base, calvarium and extracranial soft tissues. Sinuses/Orbits: No sinus fluid levels or advanced mucosal thickening. No mastoid effusion. Normal orbits. IMPRESSION: Normal head CT. Electronically Signed   By: Deatra Robinson M.D.   On: 01/29/2017 18:52   Ct Angio Neck W Or Wo Contrast  Result Date: 01/31/2017 CLINICAL DATA:  61 year old female with acute onset severe frontal headache, occipital region numbness and tingling. EXAM: CT ANGIOGRAPHY HEAD AND NECK TECHNIQUE: Multidetector CT imaging of the head and neck was performed using the standard protocol during bolus administration of intravenous contrast. Multiplanar CT image reconstructions and MIPs were obtained to evaluate the vascular anatomy. Carotid stenosis measurements (when applicable) are obtained utilizing NASCET criteria, using the distal internal carotid diameter as the denominator. CONTRAST:  75 mL Isovue 370 COMPARISON:  Brain MRI 01/30/2017.  Noncontrast head CT 01/29/2017. FINDINGS: CT HEAD Brain: No midline shift, ventriculomegaly, mass effect, evidence of mass lesion, intracranial hemorrhage or evidence of cortically based acute infarction. Gray-white matter differentiation is within normal limits throughout the brain. Calvarium and skull base: No acute osseous abnormality  identified. Paranasal sinuses: Visualized paranasal sinuses and mastoids are stable and well pneumatized. Orbits: Visualized orbits and scalp soft tissues are within normal limits. CTA NECK Skeleton: Straightening of cervical lordosis and degenerative endplate spurring in the cervical spine. Upper chest: Small calcified granuloma in the periphery of the left upper lobe. Superior mediastinal lipomatosis, no superior mediastinal lymphadenopathy. Other neck: Negative; retropharyngeal course of both carotid arteries. No cervical lymphadenopathy. Aortic arch: 3 vessel arch configuration with no atherosclerosis. Right carotid system: Tortuous right CCA. Retropharyngeal course of the right CCA, the right carotid bifurcation, and the proximal right ICA. No atherosclerosis or stenosis. Left carotid system: Negative aside from tortuosity and partial retropharyngeal course. Vertebral arteries: No proximal right subclavian artery plaque or stenosis. Normal right vertebral artery origin. Negative right vertebral artery to the skullbase. No proximal left subclavian artery plaque or stenosis. Normal left vertebral artery origin. Tortuous left V1 segment. Negative left vertebral artery to the skullbase. CTA HEAD Posterior circulation: Codominant distal vertebral arteries are normal to the vertebrobasilar junction. Patent PICA origins. Patent basilar artery without stenosis.  Normal SCA and PCA origins. Posterior communicating arteries are diminutive or absent. Bilateral PCA branches are within normal limits. Anterior circulation: Patent ICA siphons with no plaque or stenosis. Normal ophthalmic artery origins. Normal carotid termini, MCA and ACA origins. Mildly tortuous bilateral ACA is. Diminutive or absent anterior communicating artery. Bilateral ACA branches are within normal limits. Left MCA M1 segment, bifurcation, and left MCA branches are within normal limits. Right MCA M1 segment, bifurcation, and right MCA branches are  within normal limits. Venous sinuses: Patent. Anatomic variants: None. Delayed phase: No abnormal enhancement identified. Review of the MIP images confirms the above findings IMPRESSION: 1. Negative CTA Head and Neck aside from arterial tortuosity in the neck. Retropharyngeal course of both carotid arteries. 2. Stable and normal CT appearance of the brain. Electronically Signed   By: Odessa Fleming M.D.   On: 01/31/2017 12:28   Mr Brain Wo Contrast  Result Date: 01/30/2017 CLINICAL DATA:  Altered mental status for 2 days. Numbness on the right side. EXAM: MRI HEAD WITHOUT CONTRAST TECHNIQUE: Multiplanar, multiecho pulse sequences of the brain and surrounding structures were obtained without intravenous contrast. COMPARISON:  Head CT from yesterday FINDINGS: Brain: No acute infarction, hemorrhage, hydrocephalus, extra-axial collection or mass lesion. Rare FLAIR hyperintensities in the cerebral white matter are nonspecific and allowable for age, likely old microvascular insults given patient's history of hypertension and diabetes. No atrophy or demyelinating pattern. Vascular: Major vessels are patent. Skull and upper cervical spine: Negative for marrow lesion. Sinuses/Orbits: No acute finding. Symmetric incidental herniation of lacrimal glands. Other: 6 mm cyst in the left parotid. IMPRESSION: No acute finding or explanation for symptoms. Electronically Signed   By: Marnee Spring M.D.   On: 01/30/2017 08:36   Dg Chest Port 1 View  Result Date: 01/29/2017 CLINICAL DATA:  Pain EXAM: PORTABLE CHEST 1 VIEW COMPARISON:  08/09/2016 FINDINGS: The heart size and mediastinal contours are within normal limits. Low lung volumes. Both lungs are clear. The visualized skeletal structures are unremarkable. IMPRESSION: 1. Decreased lung volumes. 2. No acute process identified. Electronically Signed   By: Signa Kell M.D.   On: 01/29/2017 19:02   Scheduled Meds: . aspirin EC  325 mg Oral Daily  . DULoxetine  120 mg Oral  Daily  . gabapentin  300 mg Oral TID  . insulin aspart  0-15 Units Subcutaneous TID WC  . lisinopril  20 mg Oral Daily  . pantoprazole  40 mg Oral BID  . simvastatin  20 mg Oral Daily   Continuous Infusions: . cefTRIAXone (ROCEPHIN)  IV Stopped (01/30/17 2133)    Principal Problem:   Chest pain Active Problems:   Alcohol use disorder   Asthma   GERD   Chronic headache   Hypertension   Diabetes mellitus without complication (HCC)   Numbness and tingling of right face   Acute UTI  Time spent:   Standley Dakins, MD, FAAFP [facility] Pager [phone]  If 7PM-7AM, please contact night-coverage [email or web address] Password [email or web address] 01/31/2017, 1:22 PM    LOS: 0 days

## 2017-02-01 NOTE — Care Management (Signed)
CM attempted to contact pt's son, Reuel Boom on 01/31/2017 with no response. Pt DC'd late in the day on 01/31/17 unexpectedly. This CM wanted to make sure was able to get Rx filled, f/u appointment made and knew were his mom could get pro-bono PT from (as [facility] unable to sign [facility] orders). CM made two attempts at calling son again on 02/01/17, left 1 VM with no response.

## 2017-02-04 LAB — CULTURE, BLOOD (ROUTINE X 2)
CULTURE: NO GROWTH
Culture: NO GROWTH
SPECIAL REQUESTS: ADEQUATE

## 2017-06-26 ENCOUNTER — Ambulatory Visit
Admission: RE | Admit: 2017-06-26 | Discharge: 2017-06-26 | Disposition: A | Discharge status: Home / Self Care | Payer: PRIVATE HEALTH INSURANCE | Source: Ambulatory Visit | Attending: Oncology | Admitting: Oncology

## 2017-06-26 ENCOUNTER — Ambulatory Visit: Payer: MEDICAID | Attending: Oncology | Admitting: *Deleted

## 2017-06-26 ENCOUNTER — Encounter (INDEPENDENT_AMBULATORY_CARE_PROVIDER_SITE_OTHER): Payer: Self-pay

## 2017-06-26 VITALS — BP 128/87 | HR 97 | Temp 99.0°F | Ht 61.0 in | Wt 243.0 lb

## 2017-06-26 DIAGNOSIS — Z0000 Encounter for general adult medical examination without abnormal findings: Secondary | ICD-10-CM

## 2017-06-26 NOTE — Progress Notes (Signed)
Subjective:     Patient ID: Tiffany Boone, female   DOB: 1955-11-04, 62 y.o.   MRN: [id number]  HPI   Review of Systems     Objective:   Physical Exam  Pulmonary/Chest: Right breast exhibits no inverted nipple, no mass, no nipple discharge, no skin change and no tenderness. Left breast exhibits no inverted nipple, no mass, no nipple discharge, no skin change and no tenderness. Breasts are symmetrical.       Assessment:     62 year old Hispanic female referred to [facility] by [facility] for clinical breast exam and mammogram only. Lloyda, the interpreter present during the interview and exam.  Last pap on 06/01/15 was negative without HPV co-testing.  Clinical breast exam unremarkable.  Taught self breast awareness.  Patient has been screened for eligibility.  She does not have any insurance, Medicare or Medicaid.  She also meets financial eligibility.  Hand-out given on the Affordable Care Act.    Plan:     Screening mammogram ordered.  Will follow-up per [facility] protocol.

## 2017-06-26 NOTE — Patient Instructions (Signed)
Gave patient hand-out, Women Staying Healthy, Active and Well from [facility], with education on breast health, pap smears, heart and colon health. 

## 2017-07-05 ENCOUNTER — Encounter: Payer: Self-pay | Admitting: Neurology

## 2017-07-05 ENCOUNTER — Ambulatory Visit: Payer: Self-pay | Admitting: Neurology

## 2017-07-05 VITALS — BP 128/78 | HR 92 | Wt 254.5 lb

## 2017-07-05 DIAGNOSIS — R202 Paresthesia of skin: Secondary | ICD-10-CM

## 2017-07-05 NOTE — Patient Instructions (Signed)
   We will check MRI of the cervical spine and get blood work today.

## 2017-07-05 NOTE — Progress Notes (Signed)
Reason for visit: Paresthesia  Referring physician: Dr. Johnney Killian is a 62 y.o. female  History of present illness:  Tiffany Boone is a 62 year old right-handed Hispanic female with a 30-year history of problems with depression.  The patient has come into the office today with complaints of sudden onset of numbness and paresthesias that have affected the lower face, and both arms and legs.  The patient indicates that the problem came on overnight.  She has not had any true weakness with the onset of the sensory deficit, but she does "feel weak all over".  The patient has not had any alteration in her balance or ability to walk, she has not had any falls.  She does report some knee discomfort with ambulation, she reports that her knees will pop when she tries to walk.  The patient does have some discomfort in the neck and shoulders and back of the head.  She has not report any difficulty controlling the bowels or the bladder.  Over time, these sensory changes have not worsened, but have not improved either.  The patient indicates that she has significant problems with feeling sad, she has no enjoyment in life.  She indicates that her children do not have much contact with her even though they are local.  She is sad about this issue as well as the fact that her mother is sick recently.  The patient is on Cymbalta.  She is sent to this office for an evaluation.  In the past, she has had MRI of the brain done in October 2018 that was normal.  She had a CT angiogram of the head and neck that was unremarkable, blood work was unremarkable.  Past Medical History:  Diagnosis Date  . Asthma   . Depression   . Diabetes mellitus without complication (HCC)   . High cholesterol   . Hypertension   . Migraine     Past Surgical History:  Procedure Laterality Date  . NO PAST SURGERIES      History reviewed. No pertinent family history.  Social history:  reports that  has  never smoked. she has never used smokeless tobacco. She reports that she does not drink alcohol or use drugs.  Medications:  Prior to Admission medications   Medication Sig Start Date End Date Taking? Authorizing Provider  albuterol (PROVENTIL HFA;VENTOLIN HFA) 108 (90 Base) MCG/ACT inhaler Inhale 1-2 puffs into the lungs every 6 (six) hours as needed for wheezing or shortness of breath.   Yes [provider]  albuterol (PROVENTIL) (2.5 MG/3ML) 0.083% nebulizer solution Take 3 mLs (2.5 mg total) by nebulization every 6 (six) hours as needed for wheezing or shortness of breath. 08/09/16  Yes Lavera Guise, MD  DULoxetine (CYMBALTA) 60 MG capsule Take 120 mg by mouth daily.   Yes [provider]  ferrous sulfate 325 (65 FE) MG tablet Take 325 mg by mouth daily with breakfast.   Yes [provider]  fluticasone (FLONASE) 50 MCG/ACT nasal spray Place 2 sprays into both nostrils as needed for allergies or rhinitis.   Yes [provider]  gabapentin (NEURONTIN) 300 MG capsule Take 300 mg by mouth 3 (three) times daily.   Yes [provider]  hydrOXYzine (ATARAX/VISTARIL) 25 MG tablet Take 25 mg by mouth 3 (three) times daily as needed.   Yes [provider]  latanoprost (XALATAN) 0.005 % ophthalmic solution Place 1 drop into both eyes at bedtime. 03/13/17 03/13/18 Yes [provider]  lisinopril (PRINIVIL,ZESTRIL) 20 MG tablet Take 20 mg by mouth daily.   Yes [provider]  meloxicam (MOBIC) 15 MG tablet Take 15 mg by mouth daily.   Yes [provider]  metFORMIN (GLUCOPHAGE) 1000 MG tablet Take 1,000 mg by mouth 2 (two) times daily.   Yes [provider]  pantoprazole (PROTONIX) 40 MG tablet Take 40 mg by mouth 2 (two) times daily.   Yes [provider]  simvastatin (ZOCOR) 20 MG tablet Take 20 mg by mouth daily.   Yes [provider]     No Known Allergies  ROS:  Out of a complete 14  system review of symptoms, the patient complains only of the following symptoms, and all other reviewed systems are negative.  Chest pain Ringing in the ears, dizziness Blurred vision, loss of vision Shortness of breath, snoring Constipation Anemia Feeling hot Joint pain, joint swelling, muscle cramps, aching muscles Allergies Headache, numbness, weakness, dizziness Depression Insomnia  Blood pressure 128/78, pulse 92, weight 254 lb 8 oz (115.4 kg).  Physical Exam  General: The patient is alert and cooperative at the time of the examination.  Patient is markedly obese.  Eyes: Pupils are equal, round, and reactive to light. Discs are flat bilaterally.  Neck: The neck is supple, no carotid bruits are noted.  Respiratory: The respiratory examination is clear.  Cardiovascular: The cardiovascular examination reveals a regular rate and rhythm, no obvious murmurs or rubs are noted.  Skin: Extremities are without significant edema.  Neurologic Exam  Mental status: The patient is alert and oriented x 3 at the time of the examination. The patient has apparent normal recent and remote memory, with an apparently normal attention span and concentration ability.  Cranial nerves: Facial symmetry is present. There is good sensation of the face to pinprick and soft touch on the right, slightly decreased on the left lower face. The strength of the facial muscles and the muscles to head turning and shoulder shrug are normal bilaterally. Speech is well enunciated, no aphasia or dysarthria is noted. Extraocular movements are full. Visual fields are full. The tongue is midline, and the patient has symmetric elevation of the soft palate. No obvious hearing deficits are noted.  Motor: The motor testing reveals 5 over 5 strength of all 4 extremities. Good symmetric motor tone is noted throughout.  Sensory: Sensory testing is intact to pinprick, soft touch, vibration sensation, and position sense on all  4 extremities, with exception of some decreased pinprick sensation on the left arm and leg as compared to the right. No evidence of extinction is noted.  Coordination: Cerebellar testing reveals good finger-nose-finger and heel-to-shin bilaterally.  Gait and station: Gait is normal. Tandem gait is slightly unsteady. Romberg is negative. No drift is seen.  Reflexes: Deep tendon reflexes are symmetric, but are depressed bilaterally. Toes are downgoing bilaterally.   MRI brain 01/30/17:  IMPRESSION: No acute finding or explanation for symptoms.  * MRI scan images were reviewed online. I agree with the written report.   CTA head and neck 01/31/17:  IMPRESSION: 1. Negative CTA Head and Neck aside from arterial tortuosity in the neck. Retropharyngeal course of both carotid arteries. 2. Stable and normal CT appearance of the brain.    Assessment/Plan:  1.  Numbness, paresthesias all 4 extremities and face  2.  Depression  3.  Diabetes  The patient likely does have a stress reaction with sensory paresthesias in all 4 extremities.  The patient will  undergo further blood work, she will have MRI of the cervical spine.  If the studies are unremarkable, the patient will need further psychiatric therapy.  I would recommend starting with a counselor or psychologist.  The patient will follow-up through this office if needed.  Marlan Palau MD 07/05/2017 9:49 AM  [facility] [address] [place], [place] [address]  Phone [phone] Fax [phone]

## 2017-07-07 ENCOUNTER — Telehealth: Payer: Self-pay

## 2017-07-07 LAB — B. BURGDORFI ANTIBODIES

## 2017-07-07 LAB — SEDIMENTATION RATE: SED RATE: 19 mm/h (ref 0–40)

## 2017-07-07 LAB — COPPER, SERUM: Copper: 125 ug/dL (ref 72–166)

## 2017-07-07 LAB — HIV ANTIBODY (ROUTINE TESTING W REFLEX): HIV SCREEN 4TH GENERATION: NONREACTIVE

## 2017-07-07 LAB — ANA W/REFLEX: ANA: NEGATIVE

## 2017-07-07 NOTE — Telephone Encounter (Signed)
I called pt, using [employer], Asher MuirJamie ID# [id number] to interpret. No answer at home number, left a voicemail asking pt to call me back.

## 2017-07-07 NOTE — Telephone Encounter (Signed)
-----   Message from York Spanielharles K Willis, MD sent at 07/07/2017  7:28 AM EST -----  The blood work results are unremarkable. Please call the patient.  ----- Message ----- From: Nell RangeInterface, [facility] Lab Results In Sent: 07/06/2017   7:45 AM To: York Spanielharles K Willis, MD

## 2017-07-10 NOTE — Telephone Encounter (Signed)
Called and spoke with Tobi Bastosnna from [employer]. (Spanish interpreter). FA#[id number]. She called and LVM for pt that labs unremarkable (nothing of concern).

## 2017-07-13 ENCOUNTER — Encounter: Payer: Self-pay | Admitting: *Deleted

## 2017-07-13 NOTE — Progress Notes (Signed)
Letter mailed from the [facility] to inform patient of her normal mammogram results.  Patient is to follow-up with annual screening in one year.  HSIS to Christy. 

## 2017-07-19 ENCOUNTER — Other Ambulatory Visit: Payer: PRIVATE HEALTH INSURANCE

## 2019-03-08 ENCOUNTER — Other Ambulatory Visit: Payer: Self-pay

## 2019-03-08 ENCOUNTER — Emergency Department (HOSPITAL_COMMUNITY)
Admission: EM | Admit: 2019-03-08 | Discharge: 2019-03-09 | Disposition: A | Discharge status: Home / Self Care | Payer: Self-pay | Attending: Emergency Medicine | Admitting: Emergency Medicine

## 2019-03-08 ENCOUNTER — Encounter (HOSPITAL_COMMUNITY): Payer: Self-pay | Admitting: Emergency Medicine

## 2019-03-08 ENCOUNTER — Emergency Department (HOSPITAL_COMMUNITY): Payer: Self-pay

## 2019-03-08 DIAGNOSIS — Z79899 Other long term (current) drug therapy: Secondary | ICD-10-CM | POA: Insufficient documentation

## 2019-03-08 DIAGNOSIS — J4521 Mild intermittent asthma with (acute) exacerbation: Secondary | ICD-10-CM | POA: Insufficient documentation

## 2019-03-08 DIAGNOSIS — Z20828 Contact with and (suspected) exposure to other viral communicable diseases: Secondary | ICD-10-CM | POA: Insufficient documentation

## 2019-03-08 DIAGNOSIS — E119 Type 2 diabetes mellitus without complications: Secondary | ICD-10-CM | POA: Insufficient documentation

## 2019-03-08 DIAGNOSIS — I10 Essential (primary) hypertension: Secondary | ICD-10-CM | POA: Insufficient documentation

## 2019-03-08 DIAGNOSIS — R05 Cough: Secondary | ICD-10-CM | POA: Insufficient documentation

## 2019-03-08 DIAGNOSIS — R0789 Other chest pain: Secondary | ICD-10-CM | POA: Insufficient documentation

## 2019-03-08 DIAGNOSIS — J3489 Other specified disorders of nose and nasal sinuses: Secondary | ICD-10-CM | POA: Insufficient documentation

## 2019-03-08 DIAGNOSIS — Z7984 Long term (current) use of oral hypoglycemic drugs: Secondary | ICD-10-CM | POA: Insufficient documentation

## 2019-03-08 LAB — CBC
HCT: 40 % (ref 36.0–46.0)
Hemoglobin: 12.5 g/dL (ref 12.0–15.0)
MCH: 28.9 pg (ref 26.0–34.0)
MCHC: 31.3 g/dL (ref 30.0–36.0)
MCV: 92.6 fL (ref 80.0–100.0)
Platelets: 254 10*3/uL (ref 150–400)
RBC: 4.32 MIL/uL (ref 3.87–5.11)
RDW: 13.4 % (ref 11.5–15.5)
WBC: 8.5 10*3/uL (ref 4.0–10.5)
nRBC: 0 % (ref 0.0–0.2)

## 2019-03-08 LAB — COMPREHENSIVE METABOLIC PANEL
ALT: 29 U/L (ref 0–44)
AST: 36 U/L (ref 15–41)
Albumin: 3.9 g/dL (ref 3.5–5.0)
Alkaline Phosphatase: 95 U/L (ref 38–126)
Anion gap: 12 (ref 5–15)
BUN: 6 mg/dL — ABNORMAL LOW (ref 8–23)
CO2: 24 mmol/L (ref 22–32)
Calcium: 9.1 mg/dL (ref 8.9–10.3)
Chloride: 104 mmol/L (ref 98–111)
Creatinine, Ser: 0.52 mg/dL (ref 0.44–1.00)
GFR calc Af Amer: >60 mL/min (ref >60)
GFR calc non Af Amer: >60 mL/min (ref >60)
Glucose, Bld: 201 mg/dL — ABNORMAL HIGH (ref 70–99)
Potassium: 3.2 mmol/L — ABNORMAL LOW (ref 3.5–5.1)
Sodium: 140 mmol/L (ref 135–145)
Total Bilirubin: 0.9 mg/dL (ref 0.3–1.2)
Total Protein: 8.5 g/dL — ABNORMAL HIGH (ref 6.5–8.1)

## 2019-03-08 LAB — LIPASE, BLOOD: Lipase: 27 U/L (ref 11–51)

## 2019-03-08 LAB — TROPONIN I (HIGH SENSITIVITY): Troponin I (High Sensitivity): 4 ng/L

## 2019-03-08 MED ORDER — ASPIRIN 81 MG PO CHEW
324.0000 mg | CHEWABLE_TABLET | Freq: Once | ORAL | Status: AC
  Administered 2019-03-08: 324 mg via ORAL
  Filled 2019-03-08: qty 4

## 2019-03-08 MED ORDER — ALBUTEROL SULFATE HFA 108 (90 BASE) MCG/ACT IN AERS
8.0000 | INHALATION_SPRAY | Freq: Once | RESPIRATORY_TRACT | Status: AC
  Administered 2019-03-08: 8 via RESPIRATORY_TRACT
  Filled 2019-03-08: qty 6.7

## 2019-03-08 MED ORDER — SODIUM CHLORIDE 0.9 % IV SOLN
Freq: Once | INTRAVENOUS | Status: AC
  Administered 2019-03-09: 01:00:00 via INTRAVENOUS

## 2019-03-08 NOTE — ED Provider Notes (Signed)
[facility] EMERGENCY DEPARTMENT Provider Note   CSN: [id number] Arrival date & time: 03/08/19  1807     History   Chief Complaint Chief Complaint  Patient presents with  . Shortness of Breath    HPI Spanish video interpreter used during this visit. Tiffany Boone is a 62 y.o. female history hypertension, diabetes, high cholesterol, migraines, asthma, GERD.  Patient reports 4 days ago, Monday, she began having a "cold".  She reports she began having rhinorrhea, nonproductive cough and wheezing consistent with asthma exacerbation.  She has been treating with Tylenol and albuterol inhaler at home without relief.  She reports increasing shortness of breath since that time worsened with exertion and without alleviating factors.  Patient reports that this had began feeling better until 2 days ago.  Patient reports that 2 days ago she developed a central chest and back pain that she describes as a squeezing pressure-like sensation unlike anything she has experienced prior pain severe radiating from chest to back without clear aggravating or alleviating factors.  Patient denies fever/chills, headache/vision changes, neck pain, hemoptysis, extremity pain/swelling, color change, abdominal pain, nausea/vomiting, diarrhea, dysuria/hematuria or any additional concerns.     HPI  Past Medical History:  Diagnosis Date  . Asthma   . Depression   . Diabetes mellitus without complication ([place])   . High cholesterol   . Hypertension   . Migraine     Patient Active Problem List   Diagnosis Date Noted  . Frontal headache   . General weakness   . Chest pain 01/29/2017  . Numbness and tingling of right face 01/29/2017  . Migraine headache 01/29/2017  . Acute UTI 01/29/2017  . Hypertension   . Diabetes mellitus without complication ([place])   . HYPERLIPIDEMIA 03/06/2009  . Alcohol use disorder 03/06/2009  . Asthma 03/06/2009  . GERD 03/06/2009  . CONSTIPATION 03/06/2009  .  HEMATOCHEZIA 03/06/2009  . EXTERNAL HEMORRHOIDS 03/05/2009  . Chronic headache 03/05/2009    Past Surgical History:  Procedure Laterality Date  . NO PAST SURGERIES       OB History   No obstetric history on file.      Home Medications    Prior to Admission medications   Medication Sig Start Date End Date Taking? Authorizing Provider  albuterol (PROVENTIL HFA;VENTOLIN HFA) 108 (90 Base) MCG/ACT inhaler Inhale 1-2 puffs into the lungs every 6 (six) hours as needed for wheezing or shortness of breath.   Yes [provider]  albuterol (PROVENTIL) (2.5 MG/3ML) 0.083% nebulizer solution Take 3 mLs (2.5 mg total) by nebulization every 6 (six) hours as needed for wheezing or shortness of breath. 08/09/16  Yes Forde Dandy, MD  DULoxetine (CYMBALTA) 60 MG capsule Take 120 mg by mouth daily.   Yes [provider]  ferrous sulfate 325 (65 FE) MG tablet Take 325 mg by mouth daily with breakfast.   Yes [provider]  fluticasone (FLONASE) 50 MCG/ACT nasal spray Place 2 sprays into both nostrils as needed for allergies or rhinitis.   Yes [provider]  gabapentin (NEURONTIN) 300 MG capsule Take 300 mg by mouth 3 (three) times daily.   Yes [provider]  hydrOXYzine (ATARAX/VISTARIL) 25 MG tablet Take 25 mg by mouth 3 (three) times daily as needed.   Yes [provider]  lisinopril (PRINIVIL,ZESTRIL) 20 MG tablet Take 20 mg by mouth daily.   Yes [provider]  meloxicam (MOBIC) 15 MG tablet Take 15 mg by mouth daily.  Yes [provider]  metFORMIN (GLUCOPHAGE) 1000 MG tablet Take 1,000 mg by mouth 2 (two) times daily.   Yes [provider]  pantoprazole (PROTONIX) 40 MG tablet Take 40 mg by mouth 2 (two) times daily.   Yes [provider]  simvastatin (ZOCOR) 20 MG tablet Take 20 mg by mouth daily.   Yes [provider]    Family History No family history on file.  Social History Social  History   Tobacco Use  . Smoking status: Never Smoker  . Smokeless tobacco: Never Used  Substance Use Topics  . Alcohol use: No  . Drug use: No     Allergies   Patient has no known allergies.   Review of Systems Review of Systems Ten systems are reviewed and are negative for acute change except as noted in the HPI   Physical Exam Updated Vital Signs BP (!) 157/86 (BP Location: Right Arm)   Pulse (!) 107   Temp 99.7 F (37.6 C) (Oral)   Resp 20   Ht  (1.549 m)   Wt 124 kg   SpO2 95%   BMI 51.65 kg/m   Physical Exam Constitutional:      General: She is not in acute distress.    Appearance: Normal appearance. She is well-developed. She is not ill-appearing or diaphoretic.  HENT:     Head: Normocephalic and atraumatic.     Right Ear: External ear normal.     Left Ear: External ear normal.     Nose: Nose normal.  Eyes:     General: Vision grossly intact. Gaze aligned appropriately.     Pupils: Pupils are equal, round, and reactive to light.  Neck:     Musculoskeletal: Normal range of motion.     Trachea: Trachea and phonation normal. No tracheal deviation.  Cardiovascular:     Rate and Rhythm: Regular rhythm. Tachycardia present.     Pulses: Normal pulses.          Dorsalis pedis pulses are 2+ on the right side and 2+ on the left side.  Pulmonary:     Effort: Pulmonary effort is normal. No respiratory distress.     Breath sounds: Normal breath sounds.  Abdominal:     General: There is no distension.     Palpations: Abdomen is soft.     Tenderness: There is no abdominal tenderness. There is no guarding or rebound.  Musculoskeletal: Normal range of motion.     Right lower leg: She exhibits no tenderness. No edema.     Left lower leg: She exhibits no tenderness. No edema.  Feet:     Right foot:     Protective Sensation: 3 sites tested. 3 sites sensed.     Left foot:     Protective Sensation: 3 sites tested. 3 sites sensed.  Skin:    General: Skin is  warm and dry.  Neurological:     Mental Status: She is alert.     GCS: GCS eye subscore is 4. GCS verbal subscore is 5. GCS motor subscore is 6.     Comments: Speech is clear and goal oriented, follows commands Major Cranial nerves without deficit, no facial droop Moves extremities without ataxia, coordination intact  Psychiatric:        Behavior: Behavior normal.      ED Treatments / Results  Labs (all labs ordered are listed, but only abnormal results are displayed) Labs Reviewed  CBC  COMPREHENSIVE METABOLIC PANEL  TROPONIN  I (HIGH SENSITIVITY)    EKG None  Radiology No results found.  Procedures Procedures (including critical care time)  Medications Ordered in ED Medications - No data to display   Initial Impression / Assessment and Plan / ED Course  I have reviewed the triage vital signs and the nursing notes.  Pertinent labs & imaging results that were available during my care of the patient were reviewed by me and considered in my medical decision making (see chart for details).    On initial evaluation 63 year old female with endorse any 2 days of central chest pain radiating to her back in the setting of 4 days of shortness of breath and what she describes as an asthma exacerbation due to a cold.  She is hypertensive on the monitor with systolic blood pressure 160, diastolic 110, she reports this is abnormal for her.  She is tachycardic with rate approximately 115 bpm.  She is audibly wheezing on examination and reports this is consistent with her asthma exacerbation.  She is neurovascular intact to all 4 extremities.  Chest x-ray is unrevealing, there is concern for dissection in the setting of severe chest and back pain and new hypertension will obtain CT scan.  CBC within normal limits Chest x-ray:  IMPRESSION:  Streaky opacities are favored to reflect a combination atelectasis  and body habitus.    No focal consolidative opacity is clearly evident.    HS troponin: 4 Lipase wnl CMP nonacute -  Care handoff given to Dr. Lynelle Doctor at shift change. CT scan pending. Disposition per oncoming team.   Note: Portions of this report may have been transcribed using voice recognition software. Every effort was made to ensure accuracy; however, inadvertent computerized transcription errors may still be present. Final Clinical Impressions(s) / ED Diagnoses   Final diagnoses:  None    ED Discharge Orders    None       Elizabeth Palau 03/09/19 [id number]    Devoria Albe, MD 03/09/19 [phone]

## 2019-03-08 NOTE — ED Triage Notes (Signed)
Cough and increased sob since Monday

## 2019-03-09 ENCOUNTER — Emergency Department (HOSPITAL_COMMUNITY): Payer: Self-pay

## 2019-03-09 LAB — TROPONIN I (HIGH SENSITIVITY): Troponin I (High Sensitivity): 5 ng/L (ref <18)

## 2019-03-09 LAB — SARS CORONAVIRUS 2 (TAT 6-24 HRS): SARS Coronavirus 2: NEGATIVE

## 2019-03-09 MED ORDER — IOHEXOL 350 MG/ML SOLN
100.0000 mL | Freq: Once | INTRAVENOUS | Status: AC | PRN
  Administered 2019-03-09: 100 mL via INTRAVENOUS

## 2019-03-09 MED ORDER — AEROCHAMBER Z-STAT PLUS/MEDIUM MISC
1.0000 | Freq: Once | Status: AC
  Administered 2019-03-09: 1

## 2019-03-09 MED ORDER — ALBUTEROL SULFATE HFA 108 (90 BASE) MCG/ACT IN AERS
8.0000 | INHALATION_SPRAY | Freq: Once | RESPIRATORY_TRACT | Status: AC
  Administered 2019-03-09: 04:00:00 8 via RESPIRATORY_TRACT

## 2019-03-09 MED ORDER — PREDNISONE 20 MG PO TABS
40.0000 mg | ORAL_TABLET | Freq: Once | ORAL | Status: AC
  Administered 2019-03-09: 40 mg via ORAL
  Filled 2019-03-09: qty 2

## 2019-03-09 MED ORDER — PREDNISONE 20 MG PO TABS: ORAL_TABLET | ORAL | 0 refills | Status: AC

## 2019-03-09 NOTE — Discharge Instructions (Addendum)
Take the medication as prescribed.  Use the inhaler for wheezing and shortness of breath.  It will also help with your cough.  You can take Mucinex DM over-the-counter for your cough if needed.  Recheck if you get a fever, you struggle to breathe despite using your inhaler, or you feel like you are getting a lot worse.  Tome el medicamento segn lo prescrito. Utilice el inhalador para las sibilancias y la falta de aire. Tambin le ayudar con la tos. Puede tomar Mucinex DM sin receta para la tos si es necesario. Vuelva a verificar si tiene fiebre, le cuesta respirar a Arboriculturist de Tourist information centre manager, o siente que est empeorando mucho.

## 2019-03-09 NOTE — ED Notes (Signed)
Patient's discharge instructions reviewed by RN and Spanish interpreter.

## 2019-12-05 IMAGING — CT CT ANGIO CHEST-ABD-PELV FOR DISSECTION W/ AND WO/W CM
2 of 7 series · 14 of 46 positions shown, 16 images · IV contrast (omnipaque)
Comparison: CT dated 01/21/2004

CLINICAL DATA: Chest pain. Back pain. Concern for dissection.

EXAM:
CT ANGIOGRAPHY CHEST, ABDOMEN AND PELVIS
TECHNIQUE: Multidetector CT imaging through the chest, abdomen and pelvis was
performed using the standard protocol during bolus administration of
intravenous contrast. Multiplanar reconstructed images and MIPs were
obtained and reviewed to evaluate the vascular anatomy.
CONTRAST:  100mL OMNIPAQUE IOHEXOL 350 MG/ML SOLN

[Series 5: axial arterial · axial · arterial · 0.91mm/px · z∈[+776,+1337]mm · 11 of 219 slices shown, 13 images]
[im 16/219  soft-tissue]
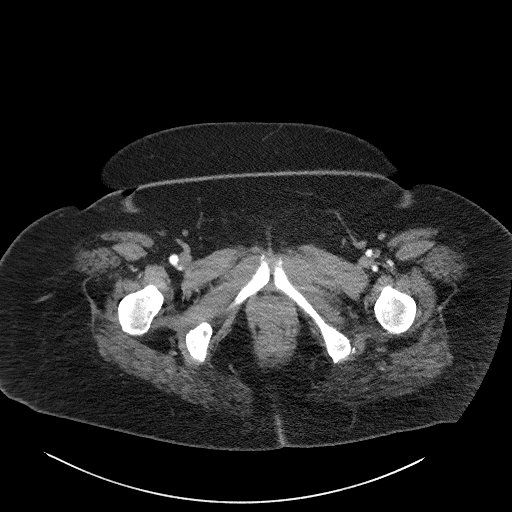
[im 16/219  bone]
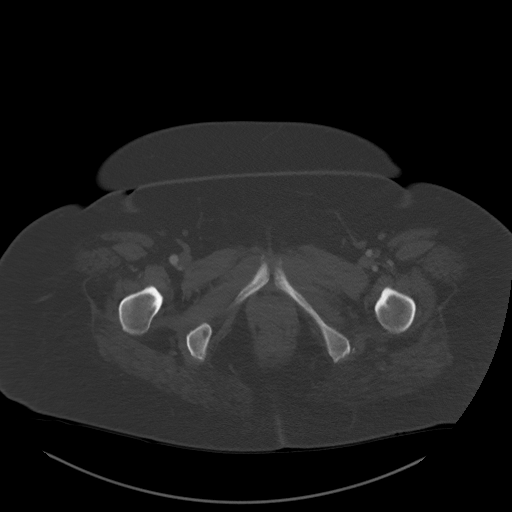
[im 32/219  soft-tissue]
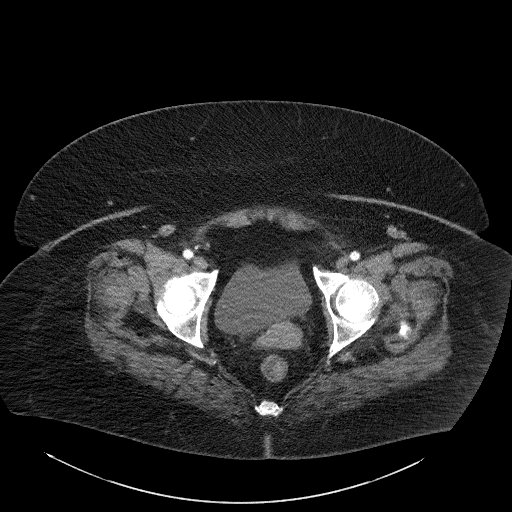
[im 47/219  soft-tissue]
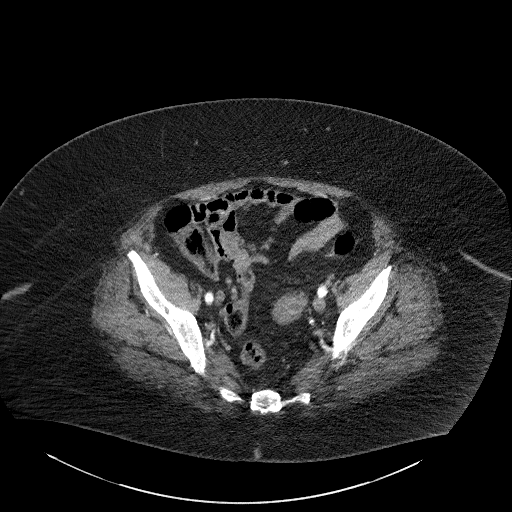
[im 78/219  soft-tissue]
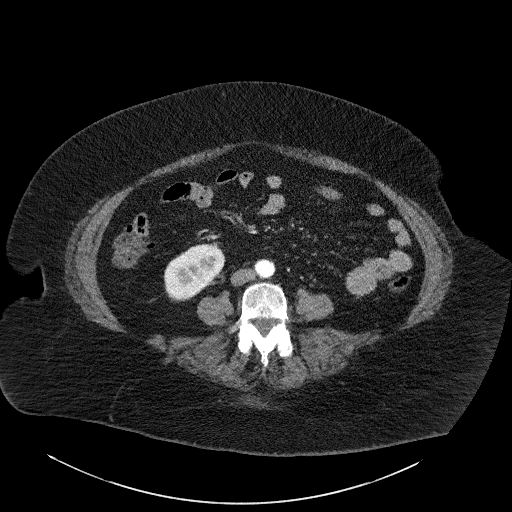
[im 94/219  soft-tissue]
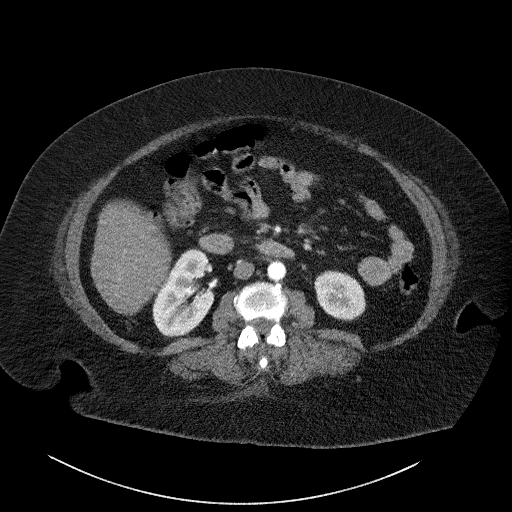
[im 110/219  soft-tissue]
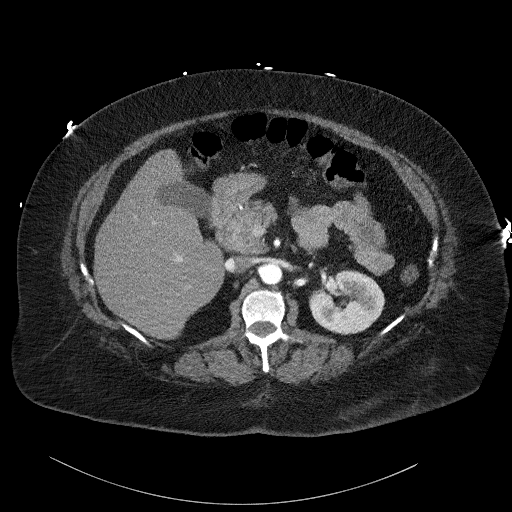
[im 125/219  soft-tissue]
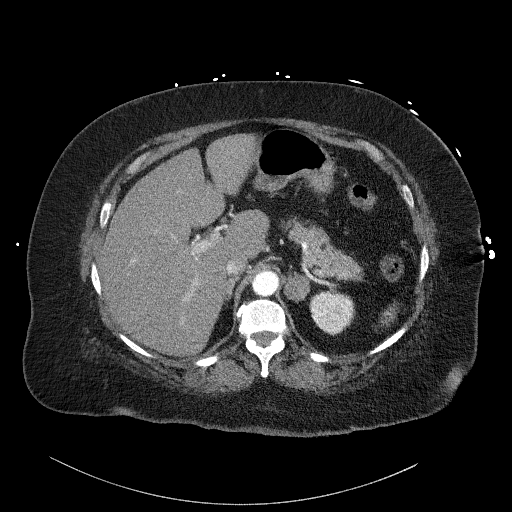
[im 141/219  soft-tissue]
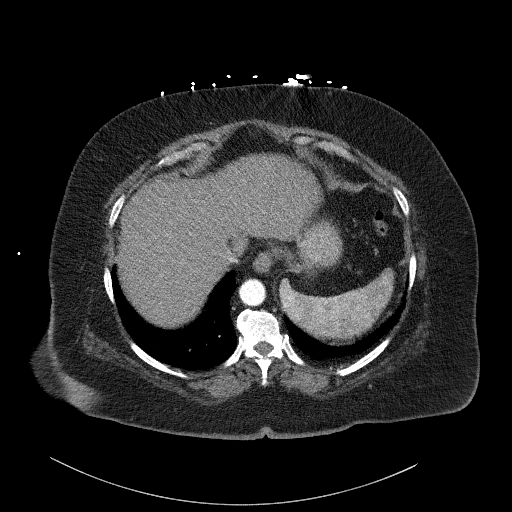
[im 172/219  soft-tissue]
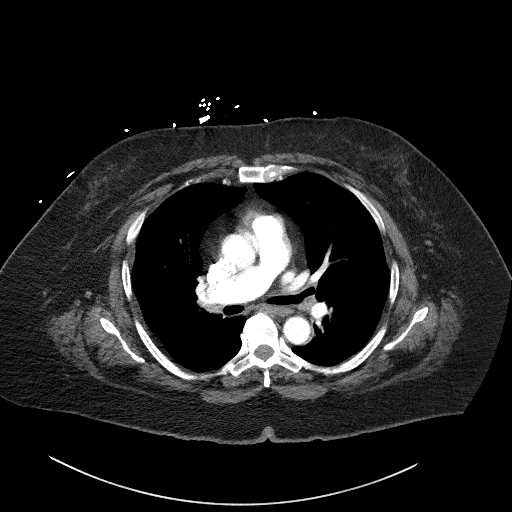
[im 172/219  bone]
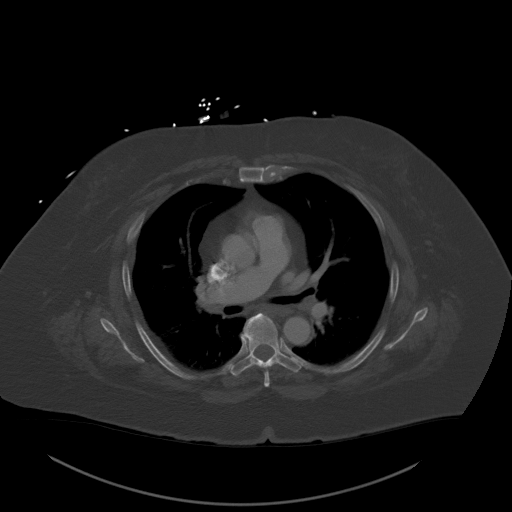
[im 187/219  soft-tissue]
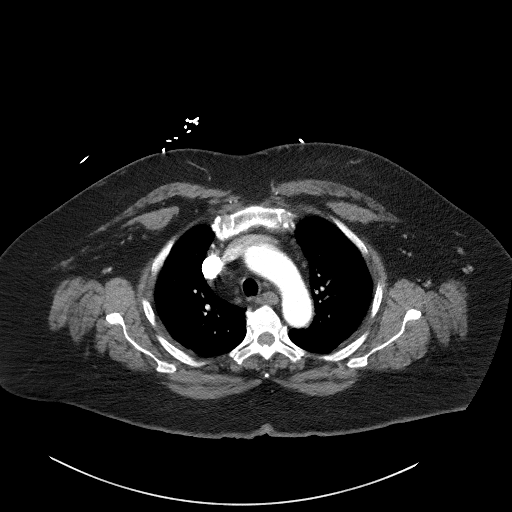
[im 203/219  soft-tissue]
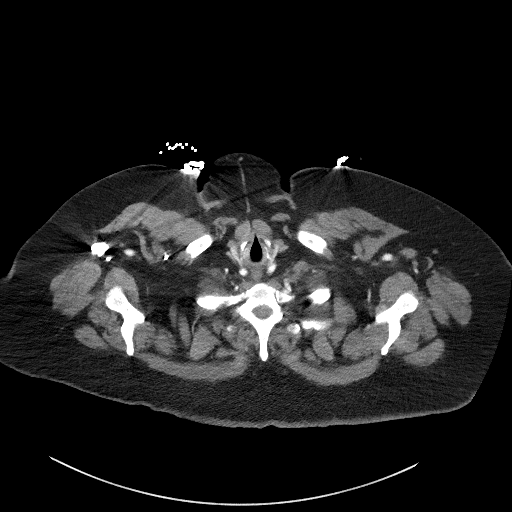

[Series 9: cor soft · coronal · 0.99mm/px · 3 of 193 slices shown]
[im 49/193  soft-tissue]
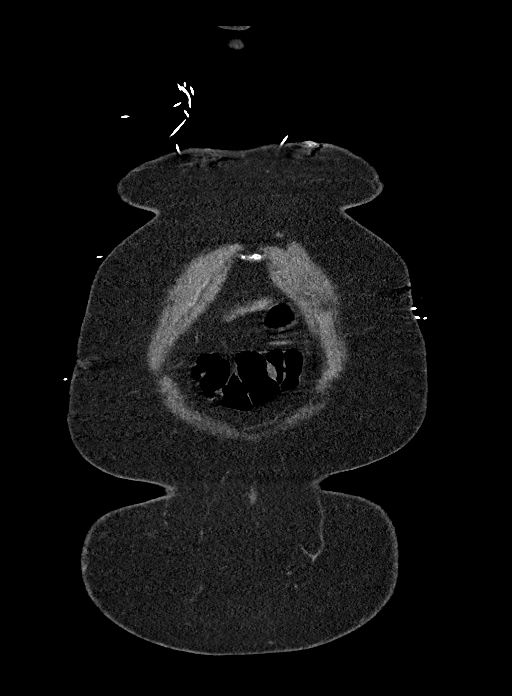
[im 97/193  soft-tissue]
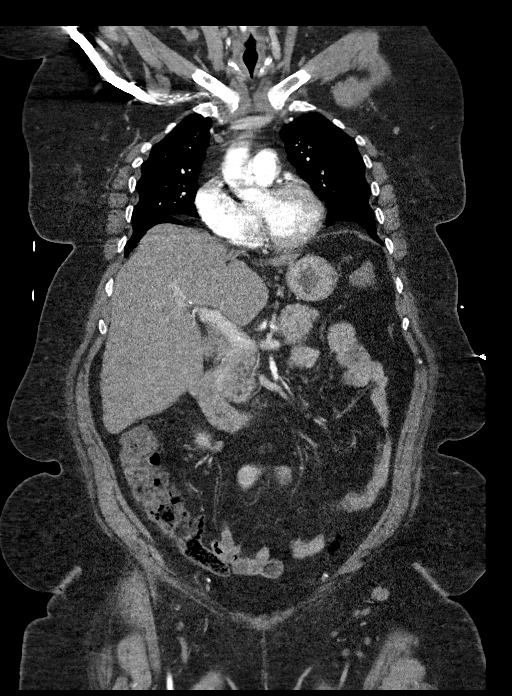
[im 145/193  soft-tissue]
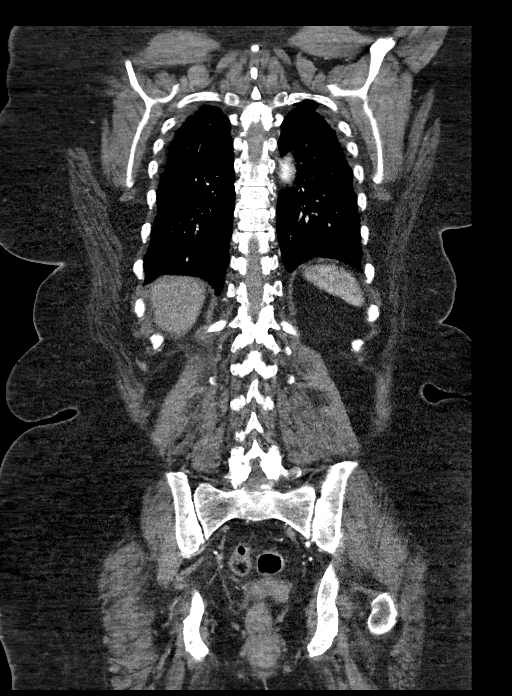

[14 of 46 positions shown; findings below may reference images not displayed]

FINDINGS: CTA CHEST FINDINGS

Cardiovascular: There is no dissection. No large pleural effusion.
The heart size is normal.

Mediastinum/Nodes:

--No mediastinal or hilar lymphadenopathy.

--No axillary lymphadenopathy.

--No supraclavicular lymphadenopathy.

--Normal thyroid gland.

--The esophagus is unremarkable

Lungs/Pleura: There is a 5 mm pulmonary nodule in the right lower
lobe (axial series 7, image 85). There is no pneumothorax. No large
pleural effusion. The trachea is unremarkable.

Musculoskeletal: No chest wall abnormality. No acute or significant
osseous findings.

Review of the MIP images confirms the above findings.

CTA ABDOMEN AND PELVIS FINDINGS

VASCULAR

Aorta: Normal caliber aorta without aneurysm, dissection, vasculitis
or significant stenosis.

Celiac: Patent without evidence of aneurysm, dissection, vasculitis
or significant stenosis.

SMA: Patent without evidence of aneurysm, dissection, vasculitis or
significant stenosis.

Renals: Both renal arteries are patent without evidence of aneurysm,
dissection, vasculitis, fibromuscular dysplasia or significant
stenosis.

IMA: Patent without evidence of aneurysm, dissection, vasculitis or
significant stenosis.

Inflow: Patent without evidence of aneurysm, dissection, vasculitis
or significant stenosis.

Veins: No obvious venous abnormality within the limitations of this
arterial phase study.

Review of the MIP images confirms the above findings.

NON-VASCULAR

Hepatobiliary: There is decreased hepatic attenuation suggestive of
hepatic steatosis. Normal gallbladder.There is no biliary ductal
dilation.

Pancreas: Normal contours without ductal dilatation. No
peripancreatic fluid collection.

Spleen: No splenic laceration or hematoma.

Adrenals/Urinary Tract:

--Adrenal glands: There is a left-sided adrenal nodule which has
grown slowly since the prior study currently measuring 2.5 cm
(previously measuring 1.5 cm). This nodule measures approximately 9
Hounsfield units and is consistent with a lipid rich benign adrenal
adenoma.

--Right kidney/ureter: No hydronephrosis or perinephric hematoma.

--Left kidney/ureter: No hydronephrosis or perinephric hematoma.

--Urinary bladder: Unremarkable.

Stomach/Bowel:

--Stomach/Duodenum: No hiatal hernia or other gastric abnormality.
Normal duodenal course and caliber.

--Small bowel: No dilatation or inflammation.

--Colon: No focal abnormality.

--Appendix: Normal.

Vascular/Lymphatic: Normal course and caliber of the major abdominal
vessels.

--No retroperitoneal lymphadenopathy.

--No mesenteric lymphadenopathy.

--No pelvic or inguinal lymphadenopathy.

Reproductive: Unremarkable

Other: No ascites or free air. The abdominal wall is normal.

Musculoskeletal. No acute displaced fractures.

Review of the MIP images confirms the above findings.
IMPRESSION: 1. No acute thoracic, abdominal or pelvic pathology. Specifically,
no evidence for aortic dissection.
2. There is a 5 mm pulmonary nodule in the right lower lobe. In a
low risk patient, no further follow-up is needed. In a high-risk
patient, a follow-up CT in 12 months is recommended.
3. Hepatic steatosis.
